# Patient Record
Sex: Female | Born: 1995
Health system: Southern US, Community
[De-identification: ages and names within clinical notes are randomized; demographics above are authoritative.]

## PROBLEM LIST (undated history)

## (undated) ENCOUNTER — Inpatient Hospital Stay: Payer: Self-pay

## (undated) DIAGNOSIS — R011 Cardiac murmur, unspecified: Secondary | ICD-10-CM

## (undated) DIAGNOSIS — F419 Anxiety disorder, unspecified: Secondary | ICD-10-CM

## (undated) DIAGNOSIS — O039 Complete or unspecified spontaneous abortion without complication: Secondary | ICD-10-CM

## (undated) HISTORY — PX: TONSILLECTOMY: SUR1361

## (undated) HISTORY — DX: Cardiac murmur, unspecified: R01.1

---
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
## 2017-06-23 DIAGNOSIS — Z0000 Encounter for general adult medical examination without abnormal findings: Secondary | ICD-10-CM | POA: Diagnosis not present

## 2017-06-23 DIAGNOSIS — Z1389 Encounter for screening for other disorder: Secondary | ICD-10-CM | POA: Diagnosis not present

## 2017-08-07 DIAGNOSIS — M222X2 Patellofemoral disorders, left knee: Secondary | ICD-10-CM | POA: Diagnosis not present

## 2017-08-17 ENCOUNTER — Encounter: Payer: Self-pay | Admitting: Advanced Practice Midwife

## 2017-08-17 ENCOUNTER — Encounter (INDEPENDENT_AMBULATORY_CARE_PROVIDER_SITE_OTHER): Payer: Self-pay

## 2017-08-17 ENCOUNTER — Ambulatory Visit (INDEPENDENT_AMBULATORY_CARE_PROVIDER_SITE_OTHER): Payer: 59 | Admitting: Advanced Practice Midwife

## 2017-08-17 VITALS — BP 102/66 | HR 92 | Ht 64.0 in | Wt 124.0 lb

## 2017-08-17 DIAGNOSIS — Z30011 Encounter for initial prescription of contraceptive pills: Secondary | ICD-10-CM

## 2017-08-17 NOTE — Patient Instructions (Signed)
HPV (Human Papillomavirus) Vaccine: What You Need to Know  1. Why get vaccinated?  HPV vaccine prevents infection with human papillomavirus (HPV) types that are associated with many cancers, including:  · cervical cancer in females,  · vaginal and vulvar cancers in females,  · anal cancer in females and males,  · throat cancer in females and males, and  · penile cancer in males.    In addition, HPV vaccine prevents infection with HPV types that cause genital warts in both females and males.  In the [place] about 12,000 women get cervical cancer every year, and about 4,000 women die from it. HPV vaccine can prevent most of these cases of cervical cancer.  Vaccination is not a substitute for cervical cancer screening. This vaccine does not protect against all HPV types that can cause cervical cancer. Women should still get regular Pap tests.  HPV infection usually comes from sexual contact, and most people will become infected at some point in their life. About 14 million Americans, including teens, get infected every year. Most infections will go away on their own and not cause serious problems. But thousands of women and men get cancer and other diseases from HPV.  2. HPV vaccine  HPV vaccine is approved by FDA and is recommended by CDC for both males and females. It is routinely given at 11 or 19 years of age, but it may be given beginning at age 9 years through age 26 years.  Most adolescents 9 through 19 years of age should get HPV vaccine as a two-dose series with the doses separated by 6-12 months. People who start HPV vaccination at 15 years of age and older should get the vaccine as a three-dose series with the second dose given 1-2 months after the first dose and the third dose given 6 months after the first dose. There are several exceptions to these age recommendations. Your health care provider can give you more information.  3. Some people should not get this vaccine   · Anyone who has had a severe (life-threatening) allergic reaction to a dose of HPV vaccine should not get another dose.  · Anyone who has a severe (life threatening) allergy to any component of HPV vaccine should not get the vaccine.  · Tell your doctor if you have any severe allergies that you know of, including a severe allergy to yeast.  · HPV vaccine is not recommended for pregnant women. If you learn that you were pregnant when you were vaccinated, there is no reason to expect any problems for you or your baby. Any woman who learns she was pregnant when she got HPV vaccine is encouraged to contact the manufacturer's registry for HPV vaccination during pregnancy at 1-800-986-8999. Women who are breastfeeding may be vaccinated.  · If you have a mild illness, such as a cold, you can probably get the vaccine today. If you are moderately or severely ill, you should probably wait until you recover. Your doctor can advise you.  4. Risks of a vaccine reaction  With any medicine, including vaccines, there is a chance of side effects. These are usually mild and go away on their own, but serious reactions are also possible.  Most people who get HPV vaccine do not have any serious problems with it.  Mild or moderate problems following HPV vaccine:  · Reactions in the arm where the shot was given:  ? Soreness (about 9 people in 10)  ? Redness or swelling (about 1 person   in 3)  · Fever:  ? Mild (100°F) (about 1 person in 10)  ? Moderate (102°F) (about 1 person in 65)  · Other problems:  ? Headache (about 1 person in 3)  Problems that could happen after any injected vaccine:  · People sometimes faint after a medical procedure, including vaccination. Sitting or lying down for about 15 minutes can help prevent fainting, and injuries caused by a fall. Tell your doctor if you feel dizzy, or have vision changes or ringing in the ears.  · Some people get severe pain in the shoulder and have difficulty moving  the arm where a shot was given. This happens very rarely.  · Any medication can cause a severe allergic reaction. Such reactions from a vaccine are very rare, estimated at about 1 in a million doses, and would happen within a few minutes to a few hours after the vaccination.  As with any medicine, there is a very remote chance of a vaccine causing a serious injury or death.  The safety of vaccines is always being monitored. For more information, visit: www.cdc.gov/vaccinesafety/.  5. What if there is a serious reaction?  What should I look for?  Look for anything that concerns you, such as signs of a severe allergic reaction, very high fever, or unusual behavior.  Signs of a severe allergic reaction can include hives, swelling of the face and throat, difficulty breathing, a fast heartbeat, dizziness, and weakness. These would usually start a few minutes to a few hours after the vaccination.  What should I do?  If you think it is a severe allergic reaction or other emergency that can't wait, call 9-1-1 or get to the nearest hospital. Otherwise, call your doctor.  Afterward, the reaction should be reported to the Vaccine Adverse Event Reporting System (VAERS). Your doctor should file this report, or you can do it yourself through the VAERS web site at www.vaers.hhs.gov, or by calling 1-800-822-7967.  VAERS does not give medical advice.  6. The National Vaccine Injury Compensation Program  The National Vaccine Injury Compensation Program (VICP) is a federal program that was created to compensate people who may have been injured by certain vaccines.  Persons who believe they may have been injured by a vaccine can learn about the program and about filing a claim by calling 1-800-338-2382 or visiting the VICP website at www.hrsa.gov/vaccinecompensation. There is a time limit to file a claim for compensation.  7. How can I learn more?  · Ask your health care provider. He or she can give you the vaccine  package insert or suggest other sources of information.  · Call your local or state health department.  · Contact the Centers for Disease Control and Prevention (CDC):  ? Call 1-800-232-4636 (1-800-CDC-INFO) or  ? Visit CDC’s website at www.cdc.gov/hpv  Vaccine Information Statement, HPV Vaccine (10/02/2015)  This information is not intended to replace advice given to you by your health care provider. Make sure you discuss any questions you have with your health care provider.  Document Released: 05/14/2014 Document Revised: 07/07/2016 Document Reviewed: 07/07/2016  Elsevier Interactive Patient Education © 2017 Elsevier Inc.

## 2017-08-17 NOTE — Progress Notes (Signed)
[facility] Visit  Patient name: Caroline Good MRN [id number]  Date of birth: 1996-04-21  CC & HPI:  Caroline Good is a 21 y.o. Caucasian female presenting today for birth control eval. Has been on Tri sprintec for 4 years.  Has gotten "cranky" over the last few m o nths.  Mom wants to see if it's d/t BC. Not likely. Happy w/COCs  Not having sex w/anyone now  Declines STD testing . Hasn't had gardisill  Discussed recommendations.   Pertinent History Reviewed:  Medical & Surgical Hx:   History reviewed. No pertinent past medical history. Past Surgical History:  Procedure Laterality Date  . TONSILLECTOMY     Family History  Problem Relation Age of Onset  . Heart disease Paternal Grandfather   . Diabetes Paternal Grandfather   . Heart attack Maternal Grandfather   . Miscarriages / Stillbirths Father   . Miscarriages / [place] Mother     Current Outpatient Prescriptions:  .  aspirin-acetaminophen-caffeine (EXCEDRIN MIGRAINE) [phone] MG per tablet, Take 1 tablet by mouth once as needed for headache., Disp: , Rfl:  .  cyclobenzaprine (FLEXERIL) 10 MG tablet, Use 1 Flexeril in the evening and then at bedtime for spasm pain., Disp: 14 tablet, Rfl: 0 .  ibuprofen (ADVIL,MOTRIN) 400 MG tablet, 1 by mouth with breakfast, after school, and at bedtime., Disp: 30 tablet, Rfl: 0 .  norgestimate-ethinyl estradiol (SPRINTEC 28) 0.25-35 MG-MCG tablet, Take 1 tablet by mouth at bedtime., Disp: , Rfl:  Social History: Reviewed -  reports that she has never smoked. She has never used smokeless tobacco.  Review of Systems:   Constitutional: Negative for fever and chills Eyes: Negative for visual disturbances Respiratory: Negative for shortness of breath, dyspnea Cardiovascular: Negative for chest pain or palpitations  Gastrointestinal: Negative for vomiting, diarrhea and constipation; no abdominal pain Genitourinary: Negative for dysuria and urgency, vaginal irritation or  itching Musculoskeletal: Negative for back pain, joint pain, myalgias  Neurological: Negative for dizziness and headaches    Objective Findings:    Physical Examination: General appearance - well appearing, and in no distress Mental status - alert, oriented to person, place, and time Chest:  Normal respiratory effort Heart - normal rate and regular rhythm Abdomen:  Soft, nontender Pelvic: deferred Musculoskeletal:  Normal range of motion without pain Extremities:  No edema    No results found for this or any previous visit (from the past 24 hour(s)).    Assessment & Plan:  A:   Moodiness, not likely d/t bc.   P:  Will change to monophasic just in case   No Follow-up on file.  CRESENZO-DISHMAN,Kieth Hartis CNM 08/17/2017 12:09 PM

## 2017-08-25 ENCOUNTER — Telehealth: Payer: Self-pay | Admitting: Advanced Practice Midwife

## 2017-08-25 NOTE — Telephone Encounter (Signed)
LMOVM that Drenda FreezeFran was out of the office and Selena BattenKim did not see in the note what she was going to prescribe. Will send to [place].

## 2017-08-29 ENCOUNTER — Telehealth: Payer: Self-pay | Admitting: Advanced Practice Midwife

## 2017-08-29 NOTE — Telephone Encounter (Signed)
Patient's mom called stating that Caroline Good was suppose to come in on Monday and see what happen regarding her daughter medication. Pt's mother would like to know if there was anything discussed. Please contact pt's mother

## 2017-08-29 NOTE — Telephone Encounter (Signed)
Please send prescription for BCP. It was not sent at last appointment.

## 2017-08-30 ENCOUNTER — Other Ambulatory Visit: Payer: Self-pay | Admitting: Advanced Practice Midwife

## 2017-08-30 MED ORDER — NORGESTIMATE-ETH ESTRADIOL 0.25-35 MG-MCG PO TABS: 1.0000 | ORAL_TABLET | Freq: Every day | ORAL | 11 refills | Status: DC

## 2017-08-30 NOTE — Telephone Encounter (Signed)
sprintec sent to pharmacy 

## 2017-08-30 NOTE — Telephone Encounter (Signed)
[facility]

## 2017-08-30 NOTE — Telephone Encounter (Signed)
rx for sprintec sent (changing to monophasic version)

## 2017-09-25 ENCOUNTER — Telehealth: Payer: Self-pay | Admitting: *Deleted

## 2017-09-25 NOTE — Telephone Encounter (Signed)
Can we send this script to [facility] for home delivery?

## 2017-09-27 ENCOUNTER — Other Ambulatory Visit: Payer: Self-pay | Admitting: Advanced Practice Midwife

## 2017-09-27 MED ORDER — NORGESTIMATE-ETH ESTRADIOL 0.25-35 MG-MCG PO TABS: 1.0000 | ORAL_TABLET | Freq: Every day | ORAL | 3 refills | Status: DC

## 2017-09-27 NOTE — Telephone Encounter (Signed)
I sent the birth control to [facility] w/90 day supply

## 2017-09-27 NOTE — Progress Notes (Signed)
rx for 3 months supply Sprintec sent to [facility]

## 2018-06-14 ENCOUNTER — Telehealth: Payer: Self-pay | Admitting: *Deleted

## 2018-06-14 NOTE — Telephone Encounter (Signed)
Pt is aware that topomax can decrease efficacy of COCs  Plans to use condoms if has sex. Doesn't want to change methods

## 2018-06-14 NOTE — Telephone Encounter (Signed)
[facility] called to check about an interaction between patients sprintec and topiramate. Topiramate decreases effectiveness of sprintec. Pharmacy wanted to know if Drenda FreezeFran still wants to prescribe it or does she want to make a change.  Callback for pharmacy. [phone] Ref number [id number]

## 2018-07-29 ENCOUNTER — Encounter (HOSPITAL_COMMUNITY): Payer: Self-pay | Admitting: Emergency Medicine

## 2018-07-29 ENCOUNTER — Other Ambulatory Visit: Payer: Self-pay

## 2018-07-29 ENCOUNTER — Emergency Department (HOSPITAL_COMMUNITY): Payer: Managed Care, Other (non HMO)

## 2018-07-29 ENCOUNTER — Emergency Department (HOSPITAL_COMMUNITY)
Admission: EM | Admit: 2018-07-29 | Discharge: 2018-07-29 | Disposition: A | Discharge status: Home / Self Care | Payer: Managed Care, Other (non HMO) | Attending: Emergency Medicine | Admitting: Emergency Medicine

## 2018-07-29 DIAGNOSIS — Z79899 Other long term (current) drug therapy: Secondary | ICD-10-CM | POA: Diagnosis not present

## 2018-07-29 DIAGNOSIS — R0602 Shortness of breath: Secondary | ICD-10-CM | POA: Diagnosis present

## 2018-07-29 DIAGNOSIS — F419 Anxiety disorder, unspecified: Secondary | ICD-10-CM | POA: Diagnosis not present

## 2018-07-29 LAB — CBC WITH DIFFERENTIAL/PLATELET
Basophils Absolute: 0 10*3/uL (ref 0.0–0.1)
Basophils Relative: 0 %
Eosinophils Absolute: 0.1 10*3/uL (ref 0.0–0.7)
Eosinophils Relative: 1 %
HEMATOCRIT: 36.7 % (ref 36.0–46.0)
Hemoglobin: 12.4 g/dL (ref 12.0–15.0)
LYMPHS ABS: 1.9 10*3/uL (ref 0.7–4.0)
LYMPHS PCT: 18 %
MCH: 31.1 pg (ref 26.0–34.0)
MCHC: 33.8 g/dL (ref 30.0–36.0)
MCV: 92 fL (ref 78.0–100.0)
MONO ABS: 0.8 10*3/uL (ref 0.1–1.0)
Monocytes Relative: 7 %
NEUTROS ABS: 7.5 10*3/uL (ref 1.7–7.7)
Neutrophils Relative %: 74 %
Platelets: 224 10*3/uL (ref 150–400)
RBC: 3.99 MIL/uL (ref 3.87–5.11)
RDW: 12.6 % (ref 11.5–15.5)
WBC: 10.3 10*3/uL (ref 4.0–10.5)

## 2018-07-29 LAB — BASIC METABOLIC PANEL
Anion gap: 9 (ref 5–15)
BUN: 9 mg/dL (ref 6–20)
CHLORIDE: 111 mmol/L (ref 98–111)
CO2: 19 mmol/L — ABNORMAL LOW (ref 22–32)
Calcium: 9.4 mg/dL (ref 8.9–10.3)
Creatinine, Ser: 0.77 mg/dL (ref 0.44–1.00)
GFR calc Af Amer: >60 mL/min (ref >60)
GFR calc non Af Amer: >60 mL/min (ref >60)
GLUCOSE: 119 mg/dL — AB (ref 70–99)
POTASSIUM: 3.2 mmol/L — AB (ref 3.5–5.1)
Sodium: 139 mmol/L (ref 135–145)

## 2018-07-29 LAB — D-DIMER, QUANTITATIVE: D-Dimer, Quant: 0.49 ug/mL-FEU (ref 0.00–0.50)

## 2018-07-29 LAB — TROPONIN I

## 2018-07-29 MED ORDER — LORAZEPAM 0.5 MG PO TABS: 0.5000 mg | ORAL_TABLET | Freq: Three times a day (TID) | ORAL | 0 refills | Status: DC

## 2018-07-29 MED ORDER — LORAZEPAM 0.5 MG PO TABS
0.5000 mg | ORAL_TABLET | Freq: Once | ORAL | Status: AC
  Administered 2018-07-29: 0.5 mg via ORAL
  Filled 2018-07-29: qty 1

## 2018-07-29 NOTE — ED Provider Notes (Signed)
[facility] EMERGENCY DEPARTMENT Provider Note   CSN: [id number] Arrival date & time: 07/29/18  1938     History   Chief Complaint Chief Complaint  Patient presents with  . Panic Attack    HPI Caroline Good is a 22 y.o. female.  HPI   Caroline Good is a 21 y.o. female who presents to the Emergency Department complaining of sudden onset of shortness of breath, increased heart rate and "my head feels like air."  Patient reports increased family stressors and family history of anxiety attacks.  She has been experiencing increased heart rate and shortness of breath intermittently for 2 weeks.  She has not seen a medical provider for this.  She states that her mother gave her a quarter of a tablet of Xanax at 11 am today which helped her symptoms.  This afternoon, she reports being upset and crying stating that her father had a heart attack 2 days ago.  She describes a near fainting episode this afternoon.  She does admit to decreased appetite and drinking energy drinks.  She denies dizziness, vomiting, fever, numbness or weakness of the extremities.  Patient does also admit to a new tattoo to her left lower leg with some associated swelling of her leg.  She denies pain to her leg with weightbearing.  She also takes oral birth control.  No hx of PE/DVT.  Family hx of generalized anxiety   History reviewed. No pertinent past medical history.  There are no active problems to display for this patient.   Past Surgical History:  Procedure Laterality Date  . TONSILLECTOMY       OB History    Gravida  0   Para  0   Term  0   Preterm  0   AB  0   Living  0     SAB  0   TAB  0   Ectopic  0   Multiple  0   Live Births  0            Home Medications    Prior to Admission medications   Medication Sig Start Date End Date Taking? Authorizing Provider  aspirin-acetaminophen-caffeine (EXCEDRIN MIGRAINE) [phone] MG per tablet Take 1 tablet by mouth once as needed  for headache.    [provider]  cyclobenzaprine (FLEXERIL) 10 MG tablet Use 1 Flexeril in the evening and then at bedtime for spasm pain. 12/28/16   Ivery Quale, PA-C  ibuprofen (ADVIL,MOTRIN) 400 MG tablet 1 by mouth with breakfast, after school, and at bedtime. 12/28/16   Ivery Quale, PA-C  norgestimate-ethinyl estradiol (SPRINTEC 28) 0.25-35 MG-MCG tablet Take 1 tablet by mouth at bedtime. 09/27/17   Jacklyn Shell, CNM    Family History Family History  Problem Relation Age of Onset  . Heart disease Paternal Grandfather   . Diabetes Paternal Grandfather   . Heart attack Maternal Grandfather   . Miscarriages / Stillbirths Father   . Miscarriages / [place] Mother     Social History Social History   Tobacco Use  . Smoking status: Never Smoker  . Smokeless tobacco: Never Used  Substance Use Topics  . Alcohol use: No  . Drug use: No     Allergies   Patient has no known allergies.   Review of Systems Review of Systems  Constitutional: Negative for chills and fever.  HENT: Negative for congestion, sore throat and trouble swallowing.   Eyes: Negative for visual disturbance.  Respiratory: Positive for shortness  of breath. Negative for cough, chest tightness and wheezing.   Cardiovascular: Negative for chest pain and palpitations.       Rapid heart rate  Gastrointestinal: Negative for abdominal pain, nausea and vomiting.  Genitourinary: Negative for dysuria and flank pain.  Neurological: Positive for headaches. Negative for dizziness, seizures, syncope, speech difficulty and weakness.     Physical Exam Updated Vital Signs BP 130/62   Pulse (!) 111   Temp 98.2 F (36.8 C) (Oral)   Resp (!) 25   Ht 5\' 4"  (1.626 m)   Wt 54.4 kg   LMP 06/28/2018 (Approximate)   SpO2 100%   BMI 20.60 kg/m   Physical Exam  Constitutional: She is oriented to person, place, and time. She appears well-developed.  Patient is anxious appearing  HENT:    Mouth/Throat: Oropharynx is clear and moist.  Eyes: Pupils are equal, round, and reactive to light. Conjunctivae and EOM are normal.  Neck: Normal range of motion. Neck supple. No JVD present.  Cardiovascular: Regular rhythm and intact distal pulses. Tachycardia present.  Pulmonary/Chest: Effort normal and breath sounds normal. No respiratory distress.  Pt breathing is shallow and rapid  Musculoskeletal: Normal range of motion.  Recent tattoo of left lower extremity.  Mild edema noted of the left lower leg.  No erythema or excessive warmth. No excessive erythema surrounding the tattoo.  Negative Homans sign  Lymphadenopathy:    She has no cervical adenopathy.  Neurological: She is alert and oriented to person, place, and time. No sensory deficit.  Skin: Skin is warm. Capillary refill takes less than 2 seconds. No rash noted.  Psychiatric: Her mood appears anxious.  Nursing note and vitals reviewed.    ED Treatments / Results  Labs (all labs ordered are listed, but only abnormal results are displayed) Labs Reviewed  BASIC METABOLIC PANEL - Abnormal; Notable for the following components:      Result Value   Potassium 3.2 (*)    CO2 19 (*)    Glucose, Bld 119 (*)    All other components within normal limits  TROPONIN I  D-DIMER, QUANTITATIVE (NOT AT [facility])  CBC WITH DIFFERENTIAL/PLATELET  POC URINE PREG, ED    EKG EKG Interpretation  Date/Time:  Sunday July 29 2018 20:48:43 EDT Ventricular Rate:  100 PR Interval:  146 QRS Duration: 84 QT Interval:  338 QTC Calculation: 436 R Axis:   88 Text Interpretation:  Normal sinus rhythm Nonspecific T wave abnormality Abnormal ECG Since last tracing rate slower Confirmed by Mancel Bale [phone]) on 07/29/2018 8:56:03 PM   Radiology Dg Chest 2 View  Result Date: 07/29/2018 CLINICAL DATA:  Shortness of breath. EXAM: CHEST - 2 VIEW COMPARISON:  Radiographs of December 11, 2014. FINDINGS: The heart size and mediastinal contours  are within normal limits. Both lungs are clear. No pneumothorax or pleural effusion is noted. The visualized skeletal structures are unremarkable. IMPRESSION: No active cardiopulmonary disease. Electronically Signed   By: Lupita Raider, M.D.   On: 07/29/2018 21:48    Procedures Procedures (including critical care time)  Medications Ordered in ED Medications - No data to display   Initial Impression / Assessment and Plan / ED Course  I have reviewed the triage vital signs and the nursing notes.  Pertinent labs & imaging results that were available during my care of the patient were reviewed by me and considered in my medical decision making (see chart for details).     Pt very anxious appearing upon arrival,  she has calmed down during ED stay.  Family at bedside talking to her.  Workup including D dimer and repeat vitals reassuring.    On recheck, pt reports feeling better and ready for d/c.  Pt advised to d/c energy drinks.  Has recent family stressors, denies SI or HI thoughts or plans.  I will provide short course of anti-anxiety med until she can f/u with her PCP this week.  Return precautions discussed.   Final Clinical Impressions(s) / ED Diagnoses   Final diagnoses:  Anxiety    ED Discharge Orders    None       Rosey Bath 07/30/18 1722    Raeford Razor, MD 07/30/18 2322

## 2018-07-29 NOTE — ED Triage Notes (Signed)
Pt states she has new onset panic attacks x 2 weeks ago, 10 mins ago started having a panic attack feeling funny, increased HR and feeling SOB, pt does not have RX meds for condition but mother gave her a 1/4 of Xanax around 1100 which helped

## 2018-07-29 NOTE — Discharge Instructions (Addendum)
As discussed, do not drink energy drinks.  Follow-up with your primary doctor later this week.  Return to the ER for any worsening symptoms.

## 2018-11-19 DIAGNOSIS — J069 Acute upper respiratory infection, unspecified: Secondary | ICD-10-CM | POA: Diagnosis not present

## 2018-11-19 DIAGNOSIS — H0520 Unspecified exophthalmos: Secondary | ICD-10-CM | POA: Diagnosis not present

## 2018-11-19 DIAGNOSIS — Z1389 Encounter for screening for other disorder: Secondary | ICD-10-CM | POA: Diagnosis not present

## 2018-12-17 ENCOUNTER — Other Ambulatory Visit: Payer: Self-pay

## 2018-12-17 ENCOUNTER — Encounter (HOSPITAL_COMMUNITY): Payer: Self-pay

## 2018-12-17 ENCOUNTER — Emergency Department (HOSPITAL_COMMUNITY): Payer: 59

## 2018-12-17 ENCOUNTER — Emergency Department (HOSPITAL_COMMUNITY)
Admission: EM | Admit: 2018-12-17 | Discharge: 2018-12-17 | Disposition: A | Discharge status: Home / Self Care | Payer: 59 | Attending: Emergency Medicine | Admitting: Emergency Medicine

## 2018-12-17 DIAGNOSIS — R0789 Other chest pain: Secondary | ICD-10-CM

## 2018-12-17 DIAGNOSIS — R112 Nausea with vomiting, unspecified: Secondary | ICD-10-CM | POA: Diagnosis not present

## 2018-12-17 DIAGNOSIS — R0602 Shortness of breath: Secondary | ICD-10-CM | POA: Insufficient documentation

## 2018-12-17 DIAGNOSIS — R42 Dizziness and giddiness: Secondary | ICD-10-CM | POA: Insufficient documentation

## 2018-12-17 DIAGNOSIS — R61 Generalized hyperhidrosis: Secondary | ICD-10-CM | POA: Insufficient documentation

## 2018-12-17 DIAGNOSIS — Z79899 Other long term (current) drug therapy: Secondary | ICD-10-CM | POA: Diagnosis not present

## 2018-12-17 DIAGNOSIS — R079 Chest pain, unspecified: Secondary | ICD-10-CM | POA: Diagnosis not present

## 2018-12-17 HISTORY — DX: Anxiety disorder, unspecified: F41.9

## 2018-12-17 LAB — CBC
HEMATOCRIT: 39.4 % (ref 36.0–46.0)
HEMOGLOBIN: 12.7 g/dL (ref 12.0–15.0)
MCH: 30.9 pg (ref 26.0–34.0)
MCHC: 32.2 g/dL (ref 30.0–36.0)
MCV: 95.9 fL (ref 80.0–100.0)
Platelets: 225 10*3/uL (ref 150–400)
RBC: 4.11 MIL/uL (ref 3.87–5.11)
RDW: 12.6 % (ref 11.5–15.5)
WBC: 8.8 10*3/uL (ref 4.0–10.5)
nRBC: 0 % (ref 0.0–0.2)

## 2018-12-17 LAB — BASIC METABOLIC PANEL
Anion gap: 9 (ref 5–15)
BUN: 15 mg/dL (ref 6–20)
CHLORIDE: 108 mmol/L (ref 98–111)
CO2: 21 mmol/L — AB (ref 22–32)
Calcium: 9 mg/dL (ref 8.9–10.3)
Creatinine, Ser: 0.58 mg/dL (ref 0.44–1.00)
GFR calc Af Amer: >60 mL/min (ref >60)
GLUCOSE: 79 mg/dL (ref 70–99)
Potassium: 4 mmol/L (ref 3.5–5.1)
SODIUM: 138 mmol/L (ref 135–145)

## 2018-12-17 LAB — I-STAT BETA HCG BLOOD, ED (NOT ORDERABLE): I-stat hCG, quantitative: <5 m[IU]/mL (ref <5)

## 2018-12-17 LAB — TROPONIN I
Troponin I: <0.03 ng/mL (ref <0.03)
Troponin I: <0.03 ng/mL (ref <0.03)

## 2018-12-17 LAB — D-DIMER, QUANTITATIVE: D-Dimer, Quant: <0.27 ug/mL-FEU (ref 0.00–0.50)

## 2018-12-17 MED ORDER — IBUPROFEN 400 MG PO TABS
600.0000 mg | ORAL_TABLET | Freq: Once | ORAL | Status: AC
  Administered 2018-12-17: 600 mg via ORAL
  Filled 2018-12-17: qty 2

## 2018-12-17 MED ORDER — IBUPROFEN 400 MG PO TABS: 400.0000 mg | ORAL_TABLET | Freq: Four times a day (QID) | ORAL | 0 refills | Status: DC | PRN

## 2018-12-17 MED ORDER — SODIUM CHLORIDE 0.9% FLUSH: 3.0000 mL | Freq: Once | INTRAVENOUS | Status: DC

## 2018-12-17 NOTE — ED Provider Notes (Signed)
[facility] EMERGENCY DEPARTMENT Provider Note   CSN: [id number] Arrival date & time: 12/17/18  1501    History   Chief Complaint Chief Complaint  Patient presents with  . Chest Pain    HPI Caroline Good is a 23 y.o. female.  She said she has had skipped heartbeats intermittently since as long as she can remember.  Since Saturday when she gets these skipped beats it is now painful.  It can occur every 15 to 30 minutes and she feels a stabbing sensation in the center of her chest.  It does not radiate anywhere.  It is not associated with shortness of breath sweatiness dizziness lightheadedness nausea or vomiting.  There is no history of any trauma.  No recent travel no immobilization.  She is on an OCP.     The history is provided by the patient.  Chest Pain  Pain location:  Substernal area Pain quality: sharp and stabbing   Pain radiates to:  Does not radiate Pain severity:  Moderate Onset quality:  Sudden Timing:  Intermittent Progression:  Unchanged Chronicity:  New Relieved by:  None tried Worsened by:  Exertion Ineffective treatments:  None tried Associated symptoms: no abdominal pain, no back pain, no cough, no diaphoresis, no fever, no headache, no heartburn, no lower extremity edema, no nausea, no shortness of breath, no syncope and no vomiting   Risk factors: birth control     Past Medical History:  Diagnosis Date  . Anxiety     There are no active problems to display for this patient.   Past Surgical History:  Procedure Laterality Date  . TONSILLECTOMY       OB History    Gravida  0   Para  0   Term  0   Preterm  0   AB  0   Living  0     SAB  0   TAB  0   Ectopic  0   Multiple  0   Live Births  0            Home Medications    Prior to Admission medications   Medication Sig Start Date End Date Taking? Authorizing Provider  aspirin-acetaminophen-caffeine (EXCEDRIN MIGRAINE) [phone] MG per tablet Take 1 tablet by mouth  once as needed for headache.    [provider]  cyclobenzaprine (FLEXERIL) 10 MG tablet Use 1 Flexeril in the evening and then at bedtime for spasm pain. 12/28/16   Ivery Quale, PA-C  ibuprofen (ADVIL,MOTRIN) 400 MG tablet 1 by mouth with breakfast, after school, and at bedtime. 12/28/16   Ivery Quale, PA-C  LORazepam (ATIVAN) 0.5 MG tablet Take 1 tablet (0.5 mg total) by mouth every 8 (eight) hours. As needed 07/29/18   Triplett, Tammy, PA-C  norgestimate-ethinyl estradiol (SPRINTEC 28) 0.25-35 MG-MCG tablet Take 1 tablet by mouth at bedtime. 09/27/17   Jacklyn Shell, CNM    Family History Family History  Problem Relation Age of Onset  . Heart disease Paternal Grandfather   . Diabetes Paternal Grandfather   . Heart attack Maternal Grandfather   . Miscarriages / Stillbirths Father   . Miscarriages / [place] Mother     Social History Social History   Tobacco Use  . Smoking status: Never Smoker  . Smokeless tobacco: Never Used  Substance Use Topics  . Alcohol use: No  . Drug use: No     Allergies   Patient has no known allergies.   Review of Systems Review  of Systems  Constitutional: Negative for diaphoresis and fever.  HENT: Negative for sore throat.   Eyes: Negative for visual disturbance.  Respiratory: Negative for cough and shortness of breath.   Cardiovascular: Positive for chest pain. Negative for syncope.  Gastrointestinal: Negative for abdominal pain, heartburn, nausea and vomiting.  Genitourinary: Negative for dysuria.  Musculoskeletal: Negative for back pain.  Skin: Negative for rash.  Neurological: Negative for headaches.     Physical Exam Updated Vital Signs BP 120/74 (BP Location: Right Arm)   Pulse 95   Temp 98.5 F (36.9 C) (Oral)   Resp 18   Ht 5\' 4"  (1.626 m)   Wt 45.8 kg   LMP 12/10/2018   SpO2 97%   BMI 17.34 kg/m   Physical Exam Vitals signs and nursing note reviewed.  Constitutional:      General: She is  not in acute distress.    Appearance: She is well-developed.  HENT:     Head: Normocephalic and atraumatic.  Eyes:     Conjunctiva/sclera: Conjunctivae normal.  Neck:     Musculoskeletal: Neck supple.  Cardiovascular:     Rate and Rhythm: Normal rate and regular rhythm.     Heart sounds: No murmur.  Pulmonary:     Effort: Pulmonary effort is normal. No respiratory distress.     Breath sounds: Normal breath sounds.  Chest:     Chest wall: Tenderness (sternal - reproduces pain) present.  Abdominal:     Palpations: Abdomen is soft.     Tenderness: There is no abdominal tenderness.  Musculoskeletal:     Right lower leg: She exhibits no tenderness. No edema.     Left lower leg: She exhibits no tenderness. No edema.  Skin:    General: Skin is warm and dry.     Capillary Refill: Capillary refill takes less than 2 seconds.  Neurological:     General: No focal deficit present.     Mental Status: She is alert and oriented to person, place, and time.     Motor: No weakness.      ED Treatments / Results  Labs (all labs ordered are listed, but only abnormal results are displayed) Labs Reviewed  BASIC METABOLIC PANEL - Abnormal; Notable for the following components:      Result Value   CO2 21 (*)    All other components within normal limits  CBC  TROPONIN I  TROPONIN I  D-DIMER, QUANTITATIVE (NOT AT [facility])  I-STAT BETA HCG BLOOD, ED ([facility], [facility], [facility] ONLY)  I-STAT BETA HCG BLOOD, ED (NOT ORDERABLE)    EKG EKG Interpretation  Date/Time:  Monday December 17 2018 19:57:07 EST Ventricular Rate:  78 PR Interval:    QRS Duration: 92 QT Interval:  385 QTC Calculation: 439 R Axis:   92 Text Interpretation:  Sinus rhythm Borderline right axis deviation RSR' in V1 or V2, probably normal variant Baseline wander in lead(s) V3 V6 similar to prior 9/19 Confirmed by Meridee ScoreButler, Analleli Gierke [phone]) on 12/17/2018 7:59:09 PM   Radiology Dg Chest 2 View  Result Date: 12/17/2018 CLINICAL DATA:  Chest  pain EXAM: CHEST - 2 VIEW COMPARISON:  July 29, 2018 FINDINGS: Lungs are clear. Heart size and pulmonary vascularity are normal. No adenopathy. No pneumothorax. No bone lesions. IMPRESSION: No edema or consolidation. Electronically Signed   By: Bretta BangWilliam  Woodruff III M.D.   On: 12/17/2018 15:40    Procedures Procedures (including critical care time)  Medications Ordered in ED Medications  sodium chloride flush (  NS) 0.9 % injection 3 mL (3 mLs Intravenous Not Given 12/17/18 1937)  ibuprofen (ADVIL,MOTRIN) tablet 600 mg (600 mg Oral Given 12/17/18 2017)     Initial Impression / Assessment and Plan / ED Course  I have reviewed the triage vital signs and the nursing notes.  Pertinent labs & imaging results that were available during my care of the patient were reviewed by me and considered in my medical decision making (see chart for details).  Clinical Course as of Dec 17 2342  Mon Dec 17, 2018  1951 ECG is normal sinus rhythm, right axis deviation no acute ST-T changes.   [MB]    Clinical Course User Index [MB] Terrilee Files, MD      Patient's lab work including delta troponin and d-dimer are negative along with a normal chest x-ray.  She has reproducible chest wall tenderness.  Will treat for costochondritis.  Final Clinical Impressions(s) / ED Diagnoses   Final diagnoses:  Chest wall pain    ED Discharge Orders         Ordered    ibuprofen (ADVIL,MOTRIN) 400 MG tablet  Every 6 hours PRN     12/17/18 2121           Terrilee Files, MD 12/17/18 2344

## 2018-12-17 NOTE — ED Triage Notes (Addendum)
Pt reports that the center of her chest is hurting and it has been hurting off and on since. Pt reports a flutter feeling and then sharp pain Pt reports feels tight all the time Mother reports 20 lb weight loss in the past month

## 2018-12-17 NOTE — Discharge Instructions (Addendum)
You were seen in the emergency department for 3 days of pain in your chest.  You had blood work chest x-ray and EKG that did not show any serious findings.  You should follow-up with your primary care doctor for further evaluation.  We are prescribing you some ibuprofen to use 3 times a day with food as needed for the pain.  Please return if any concerns.

## 2019-02-19 DIAGNOSIS — M222X2 Patellofemoral disorders, left knee: Secondary | ICD-10-CM | POA: Diagnosis not present

## 2019-04-29 ENCOUNTER — Other Ambulatory Visit: Payer: Self-pay

## 2019-04-29 ENCOUNTER — Encounter (HOSPITAL_COMMUNITY): Payer: Self-pay

## 2019-04-29 ENCOUNTER — Ambulatory Visit (HOSPITAL_COMMUNITY): Payer: 59 | Attending: Orthopedic Surgery

## 2019-04-29 DIAGNOSIS — R29898 Other symptoms and signs involving the musculoskeletal system: Secondary | ICD-10-CM | POA: Insufficient documentation

## 2019-04-29 DIAGNOSIS — M25511 Pain in right shoulder: Secondary | ICD-10-CM | POA: Diagnosis not present

## 2019-04-29 NOTE — Patient Instructions (Signed)
  Flexibility: Upper Trapezius Stretch   Gently grasp right side of head while reaching behind back with other hand. Tilt head away until a gentle stretch is felt. Hold ____ seconds. Repeat ____ times per set. Do ____ sets per session. Do ____ sessions per day.  http://orth.exer.us/340   Levator Stretch   Grasp seat or sit on hand on side to be stretched. Turn head toward other side and look down. Use hand on head to gently stretch neck in that position. Hold ____ seconds. Repeat on other side. Repeat ____ times. Do ____ sessions per day.    Posture - Sitting   Sit upright, head facing forward. Try using a roll to support lower back. Keep shoulders relaxed, and avoid rounded back. Keep hips level with knees. Avoid crossing legs for long periods.  Upper Trapezius Mobilization with ball  Pin down tender portion of upper trapezius. Roll ball across tender point for at least 2 minutes Other options: Pin down tender spot and perform active arm motions or contract/relax technique.     suboccipital release  While standing, position the base of your head to be against a pair of taped lacrosse balls. Gently move head back and forth against the balls. This exercise works to release the suboccipitals and the insertion sites of the Upper Trapezius muscles. Continue to move your head back and forth at your own pace for a total of 2 minutes.
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
## 2019-05-07 ENCOUNTER — Encounter (HOSPITAL_COMMUNITY): Payer: Self-pay

## 2019-05-07 ENCOUNTER — Ambulatory Visit (HOSPITAL_COMMUNITY): Payer: 59 | Attending: Orthopedic Surgery

## 2019-05-07 ENCOUNTER — Other Ambulatory Visit: Payer: Self-pay

## 2019-05-07 DIAGNOSIS — M25511 Pain in right shoulder: Secondary | ICD-10-CM | POA: Insufficient documentation

## 2019-05-07 DIAGNOSIS — R29898 Other symptoms and signs involving the musculoskeletal system: Secondary | ICD-10-CM

## 2019-05-07 NOTE — Therapy (Signed)
[place], [place], [address] Phone: [phone]   Fax:  [phone]  Occupational Therapy Treatment  Patient Details  Name: Caroline Good MRN: [id number] Date of Birth: 12-19-1995 Referring Provider (OT): Dr. Almedia Balls   Encounter Date: 05/07/2019  OT End of Session - 05/07/19 1422    Visit Number  2    Number of Visits  8    Date for OT Re-Evaluation  05/27/19    Authorization Type  [employer]    Authorization Time Period  60 visit limit- 0 used. No auth required.    Authorization - Visit Number  2    Authorization - Number of Visits  26    OT Start Time  3212    OT Stop Time  1413    OT Time Calculation (min)  39 min       Past Medical History:  Diagnosis Date  . Anxiety     Past Surgical History:  Procedure Laterality Date  . TONSILLECTOMY      There were no vitals filed for this visit.  Subjective Assessment - 05/07/19 1423    Subjective   S: I don't have any pain right now.    Currently in Pain?  No/denies         [facility] OT Assessment - 05/07/19 1346      Assessment   Medical Diagnosis  right shoulder and cervical pinch nerve    Prior Therapy  None for this condition               OT Treatments/Exercises (OP) - 05/07/19 1347      Exercises   Exercises  Shoulder      Shoulder Exercises: Prone   Other Prone Exercises  Bent over single arm Y and T; 2#; 12X      Shoulder Exercises: Sidelying   External Rotation  Strengthening;12 reps    External Rotation Weight (lbs)  3 with towel roll    Internal Rotation  Strengthening;12 reps    Internal Rotation Weight (lbs)  3 with towel roll    Flexion  Strengthening;12 reps    Flexion Weight (lbs)  2    ABduction  Strengthening;12 reps    ABduction Weight (lbs)  2    Other Sidelying Exercises  protraction; 2#; 12X    Other Sidelying Exercises  Horizontal abduction; 2#, 12X      Shoulder Exercises: Standing   Extension   Theraband;12 reps    Theraband Level (Shoulder Extension)  Level 2 (Red)    Row  Theraband;12 reps    Theraband Level (Shoulder Row)  Level 2 (Red)    Retraction  Theraband;12 reps    Theraband Level (Shoulder Retraction)  Level 2 (Red)      Shoulder Exercises: ROM/Strengthening   Other ROM/Strengthening Exercises  Standing Angel Wings; 10X      Manual Therapy   Manual Therapy  Myofascial release    Manual therapy comments  Manual therapy completed prior to exercises.     Myofascial Release  Myofascial release completed to right upper trapezius and scapularis region to decrease fascial restrictions and allow for ROM in a pain free zone.              OT Education - 05/07/19 1412    Education Details  Red scapular theraband exercises provided for HEP. Reviewed goals with patient.    Person(s) Educated  Patient    Methods  Explanation;Demonstration;Verbal cues;Handout  Comprehension  Returned demonstration;Verbalized understanding       OT Short Term Goals - 05/07/19 1345      OT SHORT TERM GOAL #1   Title  Patient will be educated and independent with her HEP to decrease muscle tightness and pain and allow her to complete work related task with increased comfort.    Time  4    Period  Weeks    Status  On-going    Target Date  05/27/19      OT SHORT TERM GOAL #2   Title  Patient will decrease fascial retrictions to min amount or less in order decrease pain and increase ability to complete lifting and reach tasks while using her RUE.    Time  4    Period  Weeks    Status  On-going      OT SHORT TERM GOAL #3   Title  Patient will increase right middle and lower trapezius strength to 5/5 in order to complete work related lifting tasks with increased stability and scapular stability.    Time  4    Period  Weeks    Status  On-going      OT SHORT TERM GOAL #4   Title  Patient will report a decrease of pain when using her RUE for daily and work related tasks of 3/10 on  average in order to continously use her RUE during daily and work related tasks.    Time  4    Period  Weeks    Status  On-going               Plan - 05/07/19 1412    Clinical Impression Statement  A: Initiated myofascial release and middle trapezius and scapular strengthening with use of hand weights. Updated HEP with red band scapular exercises. Required VC for form and technique. Pt reports soreness at end of session.    Body Structure / Function / Physical Skills  ADL;Fascial restriction;Strength;Pain    Plan  P: continue with scapular strengthening. Add therapy ball shoulder strengthening exercises. reverse on UBE bike. Electa [place] holds.    Consulted and Agree with Plan of Care  Patient       Patient will benefit from skilled therapeutic intervention in order to improve the following deficits and impairments:   Body Structure / Function / Physical Skills: ADL, Fascial restriction, Strength, Pain       Visit Diagnosis: 1. Other symptoms and signs involving the musculoskeletal system   2. Acute pain of right shoulder       Problem List There are no active problems to display for this patient.  Limmie PatriciaLaura Vance Hochmuth, OTR/L,CBIS  [phone]  05/07/2019, 2:23 PM  [place] [facility] [address] [place], [place], [address] Phone: [phone]   Fax:  [phone]  Name: Caroline Good MRN: [id number] Date of Birth: [birth year]

## 2019-05-07 NOTE — Patient Instructions (Signed)

## 2019-05-09 ENCOUNTER — Ambulatory Visit (HOSPITAL_COMMUNITY): Payer: 59

## 2019-05-09 ENCOUNTER — Other Ambulatory Visit: Payer: Self-pay

## 2019-05-09 ENCOUNTER — Encounter (HOSPITAL_COMMUNITY): Payer: Self-pay

## 2019-05-09 DIAGNOSIS — R29898 Other symptoms and signs involving the musculoskeletal system: Secondary | ICD-10-CM | POA: Diagnosis not present

## 2019-05-09 DIAGNOSIS — M25511 Pain in right shoulder: Secondary | ICD-10-CM

## 2019-05-09 NOTE — Therapy (Signed)
Caroline Good [address] [facility] [place], [place], [address] Phone: [phone]   Fax:  [phone]  Occupational Therapy Treatment  Patient Details  Name: Caroline Good MRN: [id number] Date of Birth: 02-03-1996 Referring Provider (OT): Dr. Lunette StandsAnna Voytek   Encounter Date: 05/09/2019  OT End of Session - 05/09/19 1413    Visit Number  3    Number of Visits  8    Date for OT Re-Evaluation  05/27/19    Authorization Type  [employer]    Authorization Time Period  60 visit limit- 0 used. No auth required.    Authorization - Visit Number  3    Authorization - Number of Visits  60    OT Start Time  1336    OT Stop Time  1415    OT Time Calculation (min)  39 min    Activity Tolerance  Patient tolerated treatment well    Behavior During Therapy  WFL for tasks assessed/performed       Past Medical History:  Diagnosis Date  . Anxiety     Past Surgical History:  Procedure Laterality Date  . TONSILLECTOMY      There were no vitals filed for this visit.  Subjective Assessment - 05/09/19 1350    Subjective   S: it was sore the night of the treatment session.    Currently in Pain?  Yes    Pain Score  2     Pain Location  Neck    Pain Orientation  Right    Pain Descriptors / Indicators  Burning    Pain Type  Acute pain    Pain Radiating Towards  radiates from neck down to shoulder    Pain Onset  1 to 4 weeks ago    Pain Frequency  Constant    Aggravating Factors   work related tasks, first treatment session    Pain Relieving Factors  over the counter pain medication    Effect of Pain on Daily Activities  patient pushes through the pain    Multiple Pain Sites  No         [facility] OT Assessment - 05/09/19 1352      Assessment   Medical Diagnosis  right shoulder and cervical pinch nerve      Precautions   Precautions  None               OT Treatments/Exercises (OP) - 05/09/19 1353      Exercises   Exercises  Shoulder       Shoulder Exercises: Prone   Other Prone Exercises  Bent over single arm Y and T; 2#; 12X      Shoulder Exercises: Sidelying   External Rotation  Strengthening;12 reps    External Rotation Weight (lbs)  3 with towel roll    Internal Rotation  Strengthening;12 reps    Internal Rotation Weight (lbs)  3 with towel roll    Flexion  Strengthening;12 reps    Flexion Weight (lbs)  2    ABduction  Strengthening;12 reps    ABduction Weight (lbs)  2    Other Sidelying Exercises  protraction; 2#; 12X    Other Sidelying Exercises  Horizontal abduction; 2#, 12X      Shoulder Exercises: Therapy Ball   Other Therapy Ball Exercises  green ball: chest press, flexion, circles, 12X      Shoulder Exercises: ROM/Strengthening   UBE (Upper Arm Bike)  Level 3 reverse only for 3'  pace: 6.0-7.0   Plank  3 reps   1: 15" 2 & 3: 20" each   Other ROM/Strengthening Exercises  Y arms lift off; 10X      Manual Therapy   Manual Therapy  Myofascial release    Manual therapy comments  Manual therapy completed prior to exercises.     Myofascial Release  Myofascial release completed to right upper trapezius and scapularis region to decrease fascial restrictions and allow for ROM in a pain free zone.                OT Short Term Goals - 05/07/19 1345      OT SHORT TERM GOAL #1   Title  Patient will be educated and independent with her HEP to decrease muscle tightness and pain and allow her to complete work related task with increased comfort.    Time  4    Period  Weeks    Status  On-going    Target Date  05/27/19      OT SHORT TERM GOAL #2   Title  Patient will decrease fascial retrictions to min amount or less in order decrease pain and increase ability to complete lifting and reach tasks while using her RUE.    Time  4    Period  Weeks    Status  On-going      OT SHORT TERM GOAL #3   Title  Patient will increase right middle and lower trapezius strength to 5/5 in order to complete work  related lifting tasks with increased stability and scapular stability.    Time  4    Period  Weeks    Status  On-going      OT SHORT TERM GOAL #4   Title  Patient will report a decrease of pain when using her RUE for daily and work related tasks of 3/10 on average in order to continously use her RUE during daily and work related tasks.    Time  4    Period  Weeks    Status  On-going               Plan - 05/09/19 1413    Clinical Impression Statement  A: Added plank holds and UBE bike to further work on increasing scapular stability and middle trapezius strength. Patient demonstrated mild muscle fatigue during session while taking appropriate rest breaks when needed. Min fascial restrictions noted in right cervical region at base of neck although less than at initial evaluation.    Body Structure / Function / Physical Skills  ADL;Fascial restriction;Strength;Pain    Plan  P: continue with scapular strengthening and plank hold attempting bird dogs also to further focus on stability and strength.    Consulted and Agree with Plan of Care  Patient       Patient will benefit from skilled therapeutic intervention in order to improve the following deficits and impairments:   Body Structure / Function / Physical Skills: ADL, Fascial restriction, Strength, Pain       Visit Diagnosis: 1. Acute pain of right shoulder   2. Other symptoms and signs involving the musculoskeletal system       Problem List There are no active problems to display for this patient.  Ailene Ravel, OTR/L,CBIS  [phone]  05/09/2019, 2:26 PM  [place] [address] [place], [place], [address] Phone: [phone]   Fax:  [phone]  Name: Caroline Good MRN: [id number] Date of Birth:  01/15/1996 

## 2019-05-14 ENCOUNTER — Ambulatory Visit (HOSPITAL_COMMUNITY): Payer: 59

## 2019-05-14 ENCOUNTER — Encounter (HOSPITAL_COMMUNITY): Payer: Self-pay

## 2019-05-14 ENCOUNTER — Other Ambulatory Visit: Payer: Self-pay

## 2019-05-14 DIAGNOSIS — M25511 Pain in right shoulder: Secondary | ICD-10-CM

## 2019-05-14 DIAGNOSIS — R29898 Other symptoms and signs involving the musculoskeletal system: Secondary | ICD-10-CM | POA: Diagnosis not present

## 2019-05-14 NOTE — Therapy (Signed)
[place], [place], [address] Phone: [phone]   Fax:  [phone]  Occupational Therapy Treatment  Patient Details  Name: DAYZEE TROWER MRN: [id number] Date of Birth: 1996/06/03 Referring Provider (OT): Dr. Almedia Balls   Encounter Date: 05/14/2019  OT End of Session - 05/14/19 1612    Visit Number  4    Number of Visits  8    Date for OT Re-Evaluation  05/27/19    Authorization Type  [employer]    Authorization Time Period  60 visit limit- 0 used. No auth required.    Authorization - Visit Number  4    Authorization - Number of Visits  13    OT Start Time  5809    OT Stop Time  1615    OT Time Calculation (min)  40 min    Activity Tolerance  Patient tolerated treatment well    Behavior During Therapy  WFL for tasks assessed/performed       Past Medical History:  Diagnosis Date  . Anxiety     Past Surgical History:  Procedure Laterality Date  . TONSILLECTOMY      There were no vitals filed for this visit.  Subjective Assessment - 05/14/19 1545    Subjective   S: It's more sore when I'm at work.    Currently in Pain?  Yes    Pain Score  2     Pain Location  Neck    Pain Orientation  Right    Pain Descriptors / Indicators  Sore    Pain Type  Acute pain         [facility] OT Assessment - 05/14/19 1546      Assessment   Medical Diagnosis  right shoulder and cervical pinch nerve      Precautions   Precautions  None               OT Treatments/Exercises (OP) - 05/14/19 1546      Exercises   Exercises  Shoulder      Shoulder Exercises: Prone   Other Prone Exercises  Bent over single arm Y and T; 2#; 15X      Shoulder Exercises: Sidelying   External Rotation  Strengthening;15 reps    External Rotation Weight (lbs)  3 with towel roll    Internal Rotation  Strengthening;15 reps    Internal Rotation Weight (lbs)  3 with towel roll    Flexion  Strengthening;15 reps    Flexion Weight (lbs)  2    ABduction  Strengthening;15 reps    ABduction Weight (lbs)  2    Other Sidelying Exercises  protraction; 2#; 15X    Other Sidelying Exercises  Horizontal abduction; 2#, 15X      Shoulder Exercises: ROM/Strengthening   UBE (Upper Arm Bike)  Level 3 reverse only for 3'   pace: 7.0-8.0   Plank  3 reps   20" hold   Other ROM/Strengthening Exercises  Red band loop: wall walks, wall lateral slide; 5X each   wall slides completed with BUE.   Other ROM/Strengthening Exercises  Bird dogs; 1 minute total while alternating legs/arms holding for 5 seconds each; slow and controlled.       Manual Therapy   Manual Therapy  Myofascial release    Manual therapy comments  Manual therapy completed prior to exercises.     Myofascial Release  Myofascial release completed to right upper trapezius and scapularis region to  decrease fascial restrictions and allow for ROM in a pain free zone.                OT Short Term Goals - 05/07/19 1345      OT SHORT TERM GOAL #1   Title  Patient will be educated and independent with her HEP to decrease muscle tightness and pain and allow her to complete work related task with increased comfort.    Time  4    Period  Weeks    Status  On-going    Target Date  05/27/19      OT SHORT TERM GOAL #2   Title  Patient will decrease fascial retrictions to min amount or less in order decrease pain and increase ability to complete lifting and reach tasks while using her RUE.    Time  4    Period  Weeks    Status  On-going      OT SHORT TERM GOAL #3   Title  Patient will increase right middle and lower trapezius strength to 5/5 in order to complete work related lifting tasks with increased stability and scapular stability.    Time  4    Period  Weeks    Status  On-going      OT SHORT TERM GOAL #4   Title  Patient will report a decrease of pain when using her RUE for daily and work related tasks of 3/10 on average in order to continously  use her RUE during daily and work related tasks.    Time  4    Period  Weeks    Status  On-going               Plan - 05/14/19 1612    Body Structure / Function / Physical Skills  ADL;Fascial restriction;Strength;Pain    Plan  P: Focus on core while integrating shoulder/scapular strength. Example: boat pose with feet on ground, side plank (bottom knee on ground), russion twist with feet on ground (hold 3 lb weight).    Consulted and Agree with Plan of Care  Patient       Patient will benefit from skilled therapeutic intervention in order to improve the following deficits and impairments:   Body Structure / Function / Physical Skills: ADL, Fascial restriction, Strength, Pain       Visit Diagnosis: 1. Acute pain of right shoulder   2. Other symptoms and signs involving the musculoskeletal system       Problem List There are no active problems to display for this patient.  Limmie PatriciaLaura Ginnifer Creelman, OTR/L,CBIS  [phone]  05/14/2019, 4:23 PM  [place] [facility] [address] [place], [place], [address] Phone: [phone]   Fax:  [phone]  Name: Fransico MeadowKatelynn L Cobb MRN: [id number] Date of Birth: November 12, 1995

## 2019-05-16 ENCOUNTER — Ambulatory Visit (HOSPITAL_COMMUNITY): Payer: 59

## 2019-05-16 ENCOUNTER — Other Ambulatory Visit: Payer: Self-pay

## 2019-05-16 ENCOUNTER — Encounter (HOSPITAL_COMMUNITY): Payer: Self-pay

## 2019-05-16 DIAGNOSIS — R29898 Other symptoms and signs involving the musculoskeletal system: Secondary | ICD-10-CM

## 2019-05-16 DIAGNOSIS — M25511 Pain in right shoulder: Secondary | ICD-10-CM

## 2019-05-16 NOTE — Therapy (Addendum)
[place], [place], [address] Phone: [phone]   Fax:  [phone]  Occupational Therapy Treatment  Patient Details  Name: Caroline Good MRN: [id number] Date of Birth: 08-22-1996 Referring Provider (OT): Dr. Almedia Balls   Encounter Date: 05/16/2019  OT End of Session - 05/16/19 1357    Visit Number  5    Number of Visits  8    Date for OT Re-Evaluation  05/27/19    Authorization Type  [employer]    Authorization Time Period  60 visit limit- 0 used. No auth required.    Authorization - Visit Number  5    Authorization - Number of Visits  44    OT Start Time  3785    OT Stop Time  1415    OT Time Calculation (min)  40 min    Activity Tolerance  Patient tolerated treatment well    Behavior During Therapy  WFL for tasks assessed/performed       Past Medical History:  Diagnosis Date  . Anxiety     Past Surgical History:  Procedure Laterality Date  . TONSILLECTOMY      There were no vitals filed for this visit.  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    Currently in Pain?  No/denies         [facility] OT Assessment - 05/16/19 1350      Assessment   Medical Diagnosis  right shoulder and cervical pinch nerve      Precautions   Precautions  None               OT Treatments/Exercises (OP) - 05/16/19 1350      Exercises   Exercises  Shoulder      Shoulder Exercises: Prone   Other Prone Exercises  Core strengthening completed each exercise for 20 seconds on and 20 seconds off. russian twist, side plank, side plank hip lift,straight leg lift, in and outs, single bent leg crunch, crunch with hands reaching over knees.       Shoulder Exercises: Sidelying   External Rotation  Strengthening;15 reps    External Rotation Weight (lbs)  3 with towel roll    Internal Rotation  Strengthening;15 reps    Internal Rotation  Weight (lbs)  3 with towel roll    Flexion  Strengthening;10 reps    Flexion Weight (lbs)  3    ABduction  Strengthening;10 reps    ABduction Weight (lbs)  3    Other Sidelying Exercises  protraction; 3#; 10X    Other Sidelying Exercises  Horizontal abduction; 3#, 10X      Shoulder Exercises: ROM/Strengthening   Other ROM/Strengthening Exercises  Red band loop: wall walks, wall lateral slide; 6X each   wall slides completed with BUE   Other ROM/Strengthening Exercises  Bird dogs; 1 minute total while alternating legs/arms holding for 5 seconds each; slow and controlled.       Manual Therapy   Manual Therapy  Myofascial release    Manual therapy comments  Manual therapy completed prior to exercises.     Myofascial Release  Myofascial release completed to right upper trapezius and scapularis region to decrease fascial restrictions and allow for ROM in a pain free zone.              OT Education - 05/16/19 1515  Education Details  core strengthening exercises    Person(s) Educated  Patient    Methods  Explanation;Demonstration;Verbal cues;Handout    Comprehension  Returned demonstration;Verbalized understanding       OT Short Term Goals - 05/07/19 1345      OT SHORT TERM GOAL #1   Title  Patient will be educated and independent with her HEP to decrease muscle tightness and pain and allow her to complete work related task with increased comfort.    Time  4    Period  Weeks    Status  On-going    Target Date  05/27/19      OT SHORT TERM GOAL #2   Title  Patient will decrease fascial retrictions to min amount or less in order decrease pain and increase ability to complete lifting and reach tasks while using her RUE.    Time  4    Period  Weeks    Status  On-going      OT SHORT TERM GOAL #3   Title  Patient will increase right middle and lower trapezius strength to 5/5 in order to complete work related lifting tasks with increased stability and scapular stability.     Time  4    Period  Weeks    Status  On-going      OT SHORT TERM GOAL #4   Title  Patient will report a decrease of pain when using her RUE for daily and work related tasks of 3/10 on average in order to continously use her RUE during daily and work related tasks.    Time  4    Period  Weeks    Status  On-going               Plan - 05/16/19 1434    Clinical Impression Statement  A: Focused on scapular strength and stability while adding core strength and stability to decrease anterior pelvic tilt while allowing her UB to be in better alignment. VC for form and technique. Manual techniques completed to address cervical fascial restrictions.    Body Structure / Function / Physical Skills  ADL;Fascial restriction;Strength;Pain    Plan  P: Continue with shoulder and scapular stability. Ad dbout pose with feet on ground arms extended. Hold 3lb weight during russian twist       Patient will benefit from skilled therapeutic intervention in order to improve the following deficits and impairments:   Body Structure / Function / Physical Skills: ADL, Fascial restriction, Strength, Pain       Visit Diagnosis: 1. Acute pain of right shoulder   2. Other symptoms and signs involving the musculoskeletal system       Problem List There are no active problems to display for this patient.  Caroline Good, OTR/L,CBIS  [phone]  05/16/2019, 3:16 PM  [place] [facility] [address] [place], [place], [address] Phone: [phone]   Fax:  [phone]  Name: Caroline Good MRN: [id number] Date of Birth: 05-Jan-1996

## 2019-05-16 NOTE — Patient Instructions (Signed)
Strength: [place] Twist  Position: Sitting with hips and knees bent, feet on the ground. Try to maintain a 45 degree angle between torso and ground.   Twist to one side and touch the the floor. Twist to the other side and do the same.  Complete as many reps as possible during the prescribed time period.  To increase difficulty, use a weight/medicine ball or twist slowly to each side in a controlled manner. Complete once a day. 1 set. 10 reps.    SUPINE BRACE - DOUBLE LEG LIFT  While lying on your back, place your hands under your buttocks. Tilt your pelvis so that your lower back moves towards the floor and hold this position.  Next, raise your legs up in the air one at a time and hold this position. Maintain your lower spine held towards the floor the entire time.   If you cannot control your spine downward, then try bending your knees as you raise them up and hold. If you lose control of your spine then the exercise is too advanced.   Complete once a day. 1 set 10 reps    Side Plank  Maintain Position   Complete on each side.   Complete once a day. 1 set each side. Hold for 20 seconds.   Side Plank with Hip Lift  Side Plank with Hip Lift     Complete on each side.   Complete once a day. 1 set each side. Hold for 20 seconds.

## 2019-05-20 ENCOUNTER — Other Ambulatory Visit: Payer: Self-pay

## 2019-05-20 ENCOUNTER — Ambulatory Visit (HOSPITAL_COMMUNITY): Payer: 59

## 2019-05-20 ENCOUNTER — Encounter (HOSPITAL_COMMUNITY): Payer: Self-pay

## 2019-05-20 DIAGNOSIS — M25511 Pain in right shoulder: Secondary | ICD-10-CM

## 2019-05-20 DIAGNOSIS — R29898 Other symptoms and signs involving the musculoskeletal system: Secondary | ICD-10-CM

## 2019-05-20 NOTE — Therapy (Signed)
[place], [place], [address] Phone: [phone]   Fax:  [phone]  Occupational Therapy Treatment  Patient Details  Name: Caroline Good MRN: [id number] Date of Birth: 04-16-1996 Referring Provider (OT): Dr. Almedia Balls   Encounter Date: 05/20/2019  OT End of Session - 05/20/19 1257    Visit Number  6    Number of Visits  8    Date for OT Re-Evaluation  05/27/19    Authorization Type  [employer]    Authorization Time Period  60 visit limit- 0 used. No auth required.    Authorization - Visit Number  6    Authorization - Number of Visits  41    OT Start Time  1220    OT Stop Time  1300    OT Time Calculation (min)  40 min    Activity Tolerance  Patient tolerated treatment well    Behavior During Therapy  WFL for tasks assessed/performed       Past Medical History:  Diagnosis Date  . Anxiety     Past Surgical History:  Procedure Laterality Date  . TONSILLECTOMY      There were no vitals filed for this visit.  Subjective Assessment - 05/20/19 1229    Subjective   S: It doesn't hurt today and it didn't yesterday. I'm off both days. I do notice that it doesn't hurt as bad when it does hurt.    Currently in Pain?  No/denies         [facility] OT Assessment - 05/20/19 1230      Assessment   Medical Diagnosis  right shoulder and cervical pinch nerve      Precautions   Precautions  None               OT Treatments/Exercises (OP) - 05/20/19 1230      Exercises   Exercises  Shoulder      Shoulder Exercises: Prone   Other Prone Exercises  Core strengthening: Russian twist with 3# weight, in/outs, boat hold with 3# weight in front, boat post with 3# held overhead. 1 time through. 20 seconds on 20 seconds off      Shoulder Exercises: Sidelying   External Rotation  Strengthening;15 reps    External Rotation Weight (lbs)  3 with towel roll    Internal Rotation  Strengthening;15 reps     Internal Rotation Weight (lbs)  3 with towel roll    Flexion  Strengthening;10 reps    Flexion Weight (lbs)  3    ABduction  Strengthening;10 reps    ABduction Weight (lbs)  3    Other Sidelying Exercises  protraction; 3#; 12X    Other Sidelying Exercises  Horizontal abduction; 3#, 12X      Shoulder Exercises: ROM/Strengthening   UBE (Upper Arm Bike)  Level 3 reverse only for 3'   pace: 9.0-10.0   Plank  3 reps   20"   X to V Arms  10X with 3#    Other ROM/Strengthening Exercises  Bodycraft pulleys: Protraction, horizontal abduction, PNF diagonal (4X with 20#, finished with 10#), external rotation; 20#, 10X     Other ROM/Strengthening Exercises  Bird dogs; 1 minute total while alternating legs/arms holding for 5 seconds each; slow and controlled.       Shoulder Exercises: Stretch   Other Shoulder Stretches  --      Manual Therapy   Manual Therapy  Myofascial release  Manual therapy comments  Manual therapy completed prior to exercises.     Myofascial Release  Myofascial release completed to right upper trapezius and scapularis region to decrease fascial restrictions and allow for ROM in a pain free zone.                OT Short Term Goals - 05/07/19 1345      OT SHORT TERM GOAL #1   Title  Patient will be educated and independent with her HEP to decrease muscle tightness and pain and allow her to complete work related task with increased comfort.    Time  4    Period  Weeks    Status  On-going    Target Date  05/27/19      OT SHORT TERM GOAL #2   Title  Patient will decrease fascial retrictions to min amount or less in order decrease pain and increase ability to complete lifting and reach tasks while using her RUE.    Time  4    Period  Weeks    Status  On-going      OT SHORT TERM GOAL #3   Title  Patient will increase right middle and lower trapezius strength to 5/5 in order to complete work related lifting tasks with increased stability and scapular  stability.    Time  4    Period  Weeks    Status  On-going      OT SHORT TERM GOAL #4   Title  Patient will report a decrease of pain when using her RUE for daily and work related tasks of 3/10 on average in order to continously use her RUE during daily and work related tasks.    Time  4    Period  Weeks    Status  On-going               Plan - 05/20/19 1316    Clinical Impression Statement  A: Continued to focus on core strengthening in addition to scapular and shoulder stability during session. Progressed core strengthening using 3# hand weight and added bodycraft pulleys for better scapular control and stability. VC for form and technique. Less fascial restrictions noted this date in right upper trapezius and lateral cervical region requiring less time for manual techniques to address.    Body Structure / Function / Physical Skills  ADL;Fascial restriction;Strength;Pain    Plan  P: Continue with soulder and scapular stability. Resume red therband loop exercises. Continue with bodycraft pulleys.    Consulted and Agree with Plan of Care  Patient       Patient will benefit from skilled therapeutic intervention in order to improve the following deficits and impairments:   Body Structure / Function / Physical Skills: ADL, Fascial restriction, Strength, Pain       Visit Diagnosis: 1. Acute pain of right shoulder   2. Other symptoms and signs involving the musculoskeletal system       Problem List There are no active problems to display for this patient.  Limmie PatriciaLaura Eileen Croswell, OTR/L,CBIS  [phone]  05/20/2019, 1:19 PM  [place] [facility] [address] [place], [place], [address] Phone: [phone]   Fax:  [phone]  Name: Caroline Good MRN: [id number] Date of Birth: [birth year]

## 2019-05-23 ENCOUNTER — Other Ambulatory Visit: Payer: Self-pay

## 2019-05-23 ENCOUNTER — Encounter (HOSPITAL_COMMUNITY): Payer: Self-pay | Admitting: Occupational Therapy

## 2019-05-23 ENCOUNTER — Ambulatory Visit (HOSPITAL_COMMUNITY): Payer: 59 | Admitting: Occupational Therapy

## 2019-05-23 DIAGNOSIS — R29898 Other symptoms and signs involving the musculoskeletal system: Secondary | ICD-10-CM | POA: Diagnosis not present

## 2019-05-23 DIAGNOSIS — M25511 Pain in right shoulder: Secondary | ICD-10-CM

## 2019-05-23 NOTE — Therapy (Signed)
[facility] [address] [place], [place], [address] Phone: [phone]   Fax:  [phone]  Occupational Therapy Treatment  Patient Details  Name: Caroline Good MRN: [id number] Date of Birth: [birth year] Referring Provider (OT): Dr. Lunette StandsAnna Voytek   Encounter Date: 05/23/2019  OT End of Session - 05/23/19 1100    Visit Number  7    Number of Visits  8    Date for OT Re-Evaluation  05/27/19    Authorization Type  [employer]    Authorization Time Period  60 visit limit- 0 used. No auth required.    Authorization - Visit Number  7    Authorization - Number of Visits  60    OT Start Time  1017    OT Stop Time  1100    OT Time Calculation (min)  43 min    Activity Tolerance  Patient tolerated treatment well    Behavior During Therapy  WFL for tasks assessed/performed       Past Medical History:  Diagnosis Date  . Anxiety     Past Surgical History:  Procedure Laterality Date  . TONSILLECTOMY      There were no vitals filed for this visit.  Subjective Assessment - 05/23/19 1017    Subjective   S: I've been off the past few days so it's not bothered me too much.    Currently in Pain?  No/denies         [facility] - 05/23/19 1017      Assessment   Medical Diagnosis  right shoulder and cervical pinch nerve      Precautions   Precautions  None               OT Treatments/Exercises (OP) - 05/23/19 1017      Exercises   Exercises  Shoulder      Shoulder Exercises: Prone   Other Prone Exercises  Core strengthening: Russian twist with 3# weight, in/outs, boat hold with 3# weight in front, boat post with 3# held overhead. 1 time through. 20 seconds on 20 seconds off      Shoulder Exercises: Sidelying   External Rotation  Strengthening;15 reps    External Rotation Weight (lbs)  3 with towel roll    Internal Rotation  Strengthening;15 reps    Internal Rotation Weight (lbs)  3 with towel roll    Flexion  Strengthening;10 reps    Flexion Weight (lbs)  3    ABduction  Strengthening;10 reps    ABduction Weight (lbs)  3    Other Sidelying Exercises  protraction; 3#; 15X    Other Sidelying Exercises  Horizontal abduction; 3#, 12X      Shoulder Exercises: ROM/Strengthening   Plank  3 reps   20"   Other ROM/Strengthening Exercises  Bodycraft pulleys: Protraction 20#, horizontal abduction 10#, PNF diagonal 20#, external rotation 10#, 10X; Red loop band: lateral wall slides, diagonal wall slides (up), wall walks, 10X each    Other ROM/Strengthening Exercises  Bird dogs; 1 minute total while alternating legs/arms holding for 5 seconds each; slow and controlled.       Manual Therapy   Manual Therapy  Myofascial release    Manual therapy comments  Manual therapy completed prior to exercises.     Myofascial Release  Myofascial release completed to right upper trapezius and scapularis region to decrease fascial restrictions and allow for ROM in a pain free zone.  OT Short Term Goals - 05/07/19 1345      OT SHORT TERM GOAL #1   Title  Patient will be educated and independent with her HEP to decrease muscle tightness and pain and allow her to complete work related task with increased comfort.    Time  4    Period  Weeks    Status  On-going    Target Date  05/27/19      OT SHORT TERM GOAL #2   Title  Patient will decrease fascial retrictions to min amount or less in order decrease pain and increase ability to complete lifting and reach tasks while using her RUE.    Time  4    Period  Weeks    Status  On-going      OT SHORT TERM GOAL #3   Title  Patient will increase right middle and lower trapezius strength to 5/5 in order to complete work related lifting tasks with increased stability and scapular stability.    Time  4    Period  Weeks    Status  On-going      OT SHORT TERM GOAL #4   Title  Patient will report a decrease of pain when using her RUE for daily  and work related tasks of 3/10 on average in order to continously use her RUE during daily and work related tasks.    Time  4    Period  Weeks    Status  On-going               Plan - 05/23/19 1100    Clinical Impression Statement  A: Continued with scapular and shoulder stability incorporating core strengthening. Resumed red loop band stability exercises today and continued with bodycraft pulley exercises. Pt requiring weight to be decreased to 10# for er today. Verbal cuing for form and technique.    Body Structure / Function / Physical Skills  ADL;Fascial restriction;Strength;Pain    Plan  P: Reassessment, determine discharge vs recertification       Patient will benefit from skilled therapeutic intervention in order to improve the following deficits and impairments:   Body Structure / Function / Physical Skills: ADL, Fascial restriction, Strength, Pain       Visit Diagnosis: 1. Acute pain of right shoulder   2. Other symptoms and signs involving the musculoskeletal system       Problem List There are no active problems to display for this patient.  Guadelupe Sabin, OTR/L  [phone] 05/23/2019, 11:02 AM  [place], [place], [address] Phone: [phone]   Fax:  [phone]  Name: Caroline Good MRN: [id number] Date of Birth: 08/02/1996

## 2019-05-27 ENCOUNTER — Ambulatory Visit (HOSPITAL_COMMUNITY): Payer: 59

## 2019-05-27 ENCOUNTER — Other Ambulatory Visit: Payer: Self-pay

## 2019-05-27 ENCOUNTER — Encounter (HOSPITAL_COMMUNITY): Payer: Self-pay

## 2019-05-27 DIAGNOSIS — R29898 Other symptoms and signs involving the musculoskeletal system: Secondary | ICD-10-CM | POA: Diagnosis not present

## 2019-05-27 DIAGNOSIS — M25511 Pain in right shoulder: Secondary | ICD-10-CM

## 2019-05-27 NOTE — Therapy (Signed)
[place], [place], [address] Phone: [phone]   Fax:  [phone]  Occupational Therapy Treatment Reassessment/re-cert Patient Details  Name: Caroline Good MRN: [id number] Date of Birth: January 07, 1996 Referring Provider (OT): Dr. Almedia Balls   Encounter Date: 05/27/2019  OT End of Session - 05/27/19 1355    Visit Number  8    Number of Visits  9    Date for OT Re-Evaluation  05/31/19    Authorization Type  [employer]    Authorization Time Period  60 visit limit- 0 used. No auth required.    Authorization - Visit Number  8    Authorization - Number of Visits  60    OT Start Time  1339   reassessment   OT Stop Time  6837    OT Time Calculation (min)  39 min    Activity Tolerance  Patient tolerated treatment well    Behavior During Therapy  WFL for tasks assessed/performed       Past Medical History:  Diagnosis Date  . Anxiety     Past Surgical History:  Procedure Laterality Date  . TONSILLECTOMY      There were no vitals filed for this visit.  Subjective Assessment - 05/27/19 1353    Subjective   S: It's not hurting as much as it used to before i started therapy.    Currently in Pain?  No/denies    Pain Score  0-No pain         [facility] OT Assessment - 05/27/19 1340      Assessment   Medical Diagnosis  right shoulder and cervical pinch nerve      Precautions   Precautions  None      Palpation   Palpation comment  Minimal fascial restrictions in right upper trapezius region.       AROM   Overall AROM   Within functional limits for tasks performed      PROM   Overall PROM   Within functional limits for tasks performed      Strength   Overall Strength Comments  Shoulder ranges: 5/5 Middle and lower trapezius: 4+/5 (eval: 4/5)                OT Treatments/Exercises (OP) - 05/27/19 1354      Exercises   Exercises  Shoulder      Shoulder Exercises: Sidelying    External Rotation  Strengthening;15 reps    External Rotation Weight (lbs)  3 with towel roll    Internal Rotation  Strengthening;15 reps    Internal Rotation Weight (lbs)  3 with towel roll    Flexion  Strengthening;15 reps    Flexion Weight (lbs)  3    ABduction  Strengthening;15 reps    ABduction Weight (lbs)  3    Other Sidelying Exercises  protraction; 3#; 15X    Other Sidelying Exercises  Horizontal abduction; 3#, 15X      Shoulder Exercises: Standing   Other Standing Exercises  Red band loop: wall slide with Y arms lift off; 10X. Wall walks 8X up. Wall clock (2,3,4 o'clock) 8X each       Shoulder Exercises: ROM/Strengthening   UBE (Upper Arm Bike)  Level 4 3' reverse only   pace: 9.0-10.0   Plank  3 reps   30" hold   X to V Arms  12X with 3#  OT Short Term Goals - 05/27/19 1348      OT SHORT TERM GOAL #1   Title  Patient will be educated and independent with her HEP to decrease muscle tightness and pain and allow her to complete work related task with increased comfort.    Time  4    Period  Weeks    Status  Achieved    Target Date  05/27/19      OT SHORT TERM GOAL #2   Title  Patient will decrease fascial retrictions to min amount or less in order decrease pain and increase ability to complete lifting and reach tasks while using her RUE.    Time  4    Period  Weeks    Status  Achieved      OT SHORT TERM GOAL #3   Title  Patient will increase right middle and lower trapezius strength to 5/5 in order to complete work related lifting tasks with increased stability and scapular stability.    Baseline  7/27: middle and lower trapezius: 4+/5    Time  4    Period  Weeks    Status  Partially Met      OT SHORT TERM GOAL #4   Title  Patient will report a decrease of pain when using her RUE for daily and work related tasks of 3/10 on average in order to continously use her RUE during daily and work related tasks.    Baseline  7/27: pt reports an  average 5/10 pain level.    Time  4    Period  Weeks    Status  Partially Met               Plan - 05/27/19 1357    Clinical Impression Statement  A: Reassessment completed this date. patient reports that her pain was 8/10 prior to starting therapy and now she experiences pain more consistantly at 5/10. She has shown an increase in her middle trapezius strength which in turn has provided her with better shoulder and scapular stability with increased posture during sitting and standing activities. With manual techniques in clinic and independently, patient has reduced her trigger points and fascial retrictions to a minimal amount. Pt wishes to finish last scheduled therapy session this week before discharging home with HEP.    Body Structure / Function / Physical Skills  ADL;Fascial restriction;Strength;Pain    OT Frequency  --   1 more visit   OT Duration  --   1 week.   Plan  P: Complete one more therapy session prior to discharge to focus on mentioned deficits. Discharge next session with appropriate HEP. Follow up with MD as needed. Complete normal treatment session while reviewing HEP and making any changes that are needed.    Consulted and Agree with Plan of Care  Patient       Patient will benefit from skilled therapeutic intervention in order to improve the following deficits and impairments:   Body Structure / Function / Physical Skills: ADL, Fascial restriction, Strength, Pain       Visit Diagnosis: 1. Acute pain of right shoulder   2. Other symptoms and signs involving the musculoskeletal system       Problem List There are no active problems to display for this patient.  Ailene Ravel, OTR/L,CBIS  [phone]  05/27/2019, 2:27 PM  [place] [address] [place], [place], [address] Phone: [phone]   Fax:  [phone]  Name:  Caroline Good MRN: [id number] Date of Birth: Apr 07, 1996

## 2019-05-29 ENCOUNTER — Other Ambulatory Visit: Payer: Self-pay

## 2019-05-29 ENCOUNTER — Other Ambulatory Visit: Payer: 59

## 2019-05-29 DIAGNOSIS — Z20822 Contact with and (suspected) exposure to covid-19: Secondary | ICD-10-CM

## 2019-05-30 ENCOUNTER — Ambulatory Visit (HOSPITAL_COMMUNITY): Payer: 59 | Admitting: Occupational Therapy

## 2019-05-30 ENCOUNTER — Telehealth (HOSPITAL_COMMUNITY): Payer: Self-pay | Admitting: Family Medicine

## 2019-05-30 NOTE — Telephone Encounter (Signed)
7/30/  848am  left patient a message to cx today's appt... does she want to reschedule for 1 more appt or she can be discharged... she needs to let us know what she would like to do

## 2019-05-30 NOTE — Telephone Encounter (Signed)
05/30/19  pt tested for COVID.Marland KitchenMarland Kitchen left a message for her to call us so we could see if she wanted to reschedule for 1 more appt or be discharged

## 2019-05-31 LAB — NOVEL CORONAVIRUS, NAA: SARS-CoV-2, NAA: DETECTED — AB

## 2019-07-11 ENCOUNTER — Encounter (HOSPITAL_COMMUNITY): Payer: Self-pay

## 2019-07-11 NOTE — Therapy (Signed)
[place], [place], [address] Phone: [phone]   Fax:  [phone]  Patient Details  Name: Caroline Good MRN: [id number] Date of Birth: [birth year] Referring Provider:  No ref. provider found  Encounter Date: 07/11/2019  OCCUPATIONAL THERAPY DISCHARGE SUMMARY  Visits from Start of Care: 8  Current functional level related to goals / functional outcomes: OT SHORT TERM GOAL #1    Title  Patient will be educated and independent with her HEP to decrease muscle tightness and pain and allow her to complete work related task with increased comfort.    Time  4    Period  Weeks    Status  Achieved    Target Date  05/27/19        OT SHORT TERM GOAL #2   Title  Patient will decrease fascial retrictions to min amount or less in order decrease pain and increase ability to complete lifting and reach tasks while using her RUE.    Time  4    Period  Weeks    Status  Achieved        OT SHORT TERM GOAL #3   Title  Patient will increase right middle and lower trapezius strength to 5/5 in order to complete work related lifting tasks with increased stability and scapular stability.    Baseline  7/27: middle and lower trapezius: 4+/5    Time  4    Period  Weeks    Status  Partially Met        OT SHORT TERM GOAL #4   Title  Patient will report a decrease of pain when using her RUE for daily and work related tasks of 3/10 on average in order to continously use her RUE during daily and work related tasks.    Baseline  7/27: pt reports an average 5/10 pain level.    Time  4    Period  Weeks    Status  Partially Met       Remaining deficits: Pt did not present to last treatment session for reassessment due to COVID testing. Unknown if patient has met goals although based on functional performance during sessions she has met all goals.    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  Patient goals were met. Patient is being discharged due to meeting the stated rehab goals.  ?????         Ailene Ravel, OTR/L,CBIS  [phone]  07/11/2019, 4:05 PM  [facility] [address] [place], [place], [address] Phone: [phone]   Fax:  [phone]

## 2019-10-29 ENCOUNTER — Ambulatory Visit (INDEPENDENT_AMBULATORY_CARE_PROVIDER_SITE_OTHER): Payer: Managed Care, Other (non HMO) | Admitting: *Deleted

## 2019-10-29 ENCOUNTER — Encounter: Payer: Self-pay | Admitting: *Deleted

## 2019-10-29 ENCOUNTER — Other Ambulatory Visit: Payer: Self-pay

## 2019-10-29 VITALS — BP 130/73 | HR 105 | Ht 64.0 in | Wt 117.0 lb

## 2019-10-29 DIAGNOSIS — Z3201 Encounter for pregnancy test, result positive: Secondary | ICD-10-CM | POA: Diagnosis not present

## 2019-10-29 LAB — POCT URINE PREGNANCY: Preg Test, Ur: POSITIVE — AB

## 2019-10-29 NOTE — Progress Notes (Signed)
Chart reviewed for nurse visit. Agree with plan of care.  Estill Dooms, NP 10/29/2019 1:46 PM

## 2019-10-29 NOTE — Progress Notes (Signed)
   NURSE VISIT- PREGNANCY CONFIRMATION   SUBJECTIVE:  Caroline Good is a 23 y.o. G1P0000 female at [redacted]w[redacted]d by certain LMP of Patient's last menstrual period was 09/28/2019 (exact date). Here for pregnancy confirmation.  Home pregnancy test: positive x 1  She reports nausea.  She is not taking prenatal vitamins.    OBJECTIVE:  BP 130/73 (BP Location: Left Arm, Patient Position: Sitting, Cuff Size: Normal)   Pulse (!) 105   Ht 5\' 4"  (1.626 m)   Wt 117 lb (53.1 kg)   LMP 09/28/2019 (Exact Date)   BMI 20.08 kg/m   Appears well, in no apparent distress OB History  Gravida Para Term Preterm AB Living  1 0 0 0 0 0  SAB TAB Ectopic Multiple Live Births  0 0 0 0 0    # Outcome Date GA Lbr Len/2nd Weight Sex Delivery Anes PTL Lv  1 Current             Results for orders placed or performed in visit on 10/29/19 (from the past 24 hour(s))  POCT urine pregnancy   Collection Time: 10/29/19 11:25 AM  Result Value Ref Range   Preg Test, Ur Positive (A) Negative    ASSESSMENT: Positive pregnancy test, [redacted]w[redacted]d by LMP    PLAN: Schedule for dating ultrasound in 3 weeks Prenatal vitamins: plans to begin OTC ASAP   Nausea medicines: not currently needed   OB packet given: Yes  Alice Rieger  10/29/2019 11:25 AM

## 2019-11-18 ENCOUNTER — Other Ambulatory Visit: Payer: Self-pay | Admitting: Obstetrics and Gynecology

## 2019-11-18 DIAGNOSIS — O3680X0 Pregnancy with inconclusive fetal viability, not applicable or unspecified: Secondary | ICD-10-CM

## 2019-11-19 ENCOUNTER — Other Ambulatory Visit: Payer: Self-pay

## 2019-11-19 ENCOUNTER — Other Ambulatory Visit: Payer: Self-pay | Admitting: Obstetrics and Gynecology

## 2019-11-19 ENCOUNTER — Other Ambulatory Visit: Payer: Managed Care, Other (non HMO)

## 2019-11-19 ENCOUNTER — Ambulatory Visit (INDEPENDENT_AMBULATORY_CARE_PROVIDER_SITE_OTHER): Payer: Managed Care, Other (non HMO)

## 2019-11-19 DIAGNOSIS — Z3A01 Less than 8 weeks gestation of pregnancy: Secondary | ICD-10-CM | POA: Diagnosis not present

## 2019-11-19 DIAGNOSIS — Z3201 Encounter for pregnancy test, result positive: Secondary | ICD-10-CM

## 2019-11-19 DIAGNOSIS — O3680X0 Pregnancy with inconclusive fetal viability, not applicable or unspecified: Secondary | ICD-10-CM

## 2019-11-19 NOTE — Progress Notes (Signed)
[place] 6+5 wks GS,no YS or fetal pole visualized,GS 18.4 mm,GS sac is located mid/LUS,normal ovaries,pt will have labs and f/u ultrasound in two weeks per Victorino Dike

## 2019-11-20 ENCOUNTER — Telehealth: Payer: Self-pay | Admitting: Adult Health

## 2019-11-20 ENCOUNTER — Telehealth: Payer: Self-pay | Admitting: *Deleted

## 2019-11-20 LAB — BETA HCG QUANT (REF LAB): hCG Quant: 18030 m[IU]/mL

## 2019-11-20 LAB — PROGESTERONE: Progesterone: 6.9 ng/mL

## 2019-11-20 MED ORDER — PROGESTERONE MICRONIZED 200 MG PO CAPS: 200.0000 mg | ORAL_CAPSULE | Freq: Every day | ORAL | 3 refills | Status: DC

## 2019-11-20 MED ORDER — PROGESTERONE MICRONIZED 200 MG PO CAPS: ORAL_CAPSULE | ORAL | 3 refills | Status: DC

## 2019-11-20 NOTE — Telephone Encounter (Signed)
Mail box is full, QHCG places her at 6-7 weeks and has low progesterone level, will supplement with Prometrium

## 2019-11-20 NOTE — Telephone Encounter (Signed)
Mailbox is full.

## 2019-11-20 NOTE — Telephone Encounter (Signed)
Pt left  Message that she sees her results in mychart but doesn't understand.

## 2019-11-25 ENCOUNTER — Telehealth: Payer: Self-pay | Admitting: *Deleted

## 2019-11-25 MED ORDER — PROMETHAZINE HCL 25 MG PO TABS: 25.0000 mg | ORAL_TABLET | Freq: Four times a day (QID) | ORAL | 1 refills | Status: DC | PRN

## 2019-11-25 NOTE — Telephone Encounter (Signed)
Will rx phenergan  

## 2019-11-25 NOTE — Addendum Note (Signed)
Addended by: Cyril Mourning A on: 11/25/2019 05:01 PM   Modules accepted: Orders

## 2019-11-25 NOTE — Telephone Encounter (Signed)
Pt requesting rx for nausea. She is having a lot of nausea and vomiting.

## 2019-12-06 ENCOUNTER — Other Ambulatory Visit: Payer: Self-pay | Admitting: Adult Health

## 2019-12-06 DIAGNOSIS — O3680X0 Pregnancy with inconclusive fetal viability, not applicable or unspecified: Secondary | ICD-10-CM

## 2019-12-09 ENCOUNTER — Encounter: Payer: Self-pay | Admitting: Adult Health

## 2019-12-09 ENCOUNTER — Ambulatory Visit (INDEPENDENT_AMBULATORY_CARE_PROVIDER_SITE_OTHER): Payer: 59 | Admitting: Adult Health

## 2019-12-09 ENCOUNTER — Encounter: Payer: Managed Care, Other (non HMO) | Admitting: Adult Health

## 2019-12-09 ENCOUNTER — Ambulatory Visit (INDEPENDENT_AMBULATORY_CARE_PROVIDER_SITE_OTHER): Payer: 59

## 2019-12-09 ENCOUNTER — Other Ambulatory Visit: Payer: Self-pay | Admitting: Adult Health

## 2019-12-09 ENCOUNTER — Other Ambulatory Visit: Payer: Self-pay

## 2019-12-09 VITALS — BP 118/69 | HR 100 | Ht 64.0 in | Wt 116.0 lb

## 2019-12-09 DIAGNOSIS — O039 Complete or unspecified spontaneous abortion without complication: Secondary | ICD-10-CM | POA: Diagnosis not present

## 2019-12-09 DIAGNOSIS — Z3A10 10 weeks gestation of pregnancy: Secondary | ICD-10-CM

## 2019-12-09 DIAGNOSIS — O3680X0 Pregnancy with inconclusive fetal viability, not applicable or unspecified: Secondary | ICD-10-CM

## 2019-12-09 NOTE — Progress Notes (Signed)
  Subjective:     Patient ID: Caroline Good, female   DOB: 1996-09-24, 24 y.o.   MRN: [id number]  HPI Caroline Good is a 24 year old white female,single in for follow up [place].She was less than dates, and no fetal pole with lst [place] and had low progesterone level. PCP is Dr Hilma Favors.  Review of Systems Has been bleeding for 3-4 days now Reviewed past medical,surgical, social and family history. Reviewed medications and allergies.     Objective:   Physical Exam BP 118/69 (BP Location: Left Arm, Patient Position: Sitting, Cuff Size: Normal)   Pulse 100   Ht '5\' 4"'$  (1.626 m)   Wt 116 lb (52.6 kg)   LMP 09/28/2019 (Exact Date)   BMI 19.91 kg/m Fall risk is low Talk only:US showed 8 week GS, 29.3 mm, no fetal pole or YS Discussed options of expectant or medical management, and she will talk with boyfriend and let me know.    Assessment:     1. Miscarriage Will talk in am     Plan:     Return in 1 week, will check [facility] and [facility]

## 2019-12-09 NOTE — Progress Notes (Signed)
[place] 8 wks GS,no ys or fetal pole,GS 29.3 mm,normal ovaries

## 2019-12-10 NOTE — Progress Notes (Signed)
This encounter was created in error - please disregard.

## 2019-12-15 ENCOUNTER — Emergency Department (HOSPITAL_COMMUNITY)
Admission: EM | Admit: 2019-12-15 | Discharge: 2019-12-15 | Disposition: A | Discharge status: Home / Self Care | Payer: 59 | Attending: Emergency Medicine | Admitting: Emergency Medicine

## 2019-12-15 ENCOUNTER — Encounter (HOSPITAL_COMMUNITY): Payer: Self-pay | Admitting: *Deleted

## 2019-12-15 ENCOUNTER — Other Ambulatory Visit: Payer: Self-pay

## 2019-12-15 DIAGNOSIS — N939 Abnormal uterine and vaginal bleeding, unspecified: Secondary | ICD-10-CM | POA: Insufficient documentation

## 2019-12-15 DIAGNOSIS — O039 Complete or unspecified spontaneous abortion without complication: Secondary | ICD-10-CM

## 2019-12-15 DIAGNOSIS — Z8759 Personal history of other complications of pregnancy, childbirth and the puerperium: Secondary | ICD-10-CM | POA: Insufficient documentation

## 2019-12-15 DIAGNOSIS — R102 Pelvic and perineal pain: Secondary | ICD-10-CM | POA: Diagnosis not present

## 2019-12-15 HISTORY — DX: Complete or unspecified spontaneous abortion without complication: O03.9

## 2019-12-15 LAB — CBC WITH DIFFERENTIAL/PLATELET
Abs Immature Granulocytes: 0.04 10*3/uL (ref 0.00–0.07)
Basophils Absolute: 0 10*3/uL (ref 0.0–0.1)
Basophils Relative: 0 %
Eosinophils Absolute: 0.1 10*3/uL (ref 0.0–0.5)
Eosinophils Relative: 1 %
HCT: 27.6 % — ABNORMAL LOW (ref 36.0–46.0)
Hemoglobin: 9.3 g/dL — ABNORMAL LOW (ref 12.0–15.0)
Immature Granulocytes: 1 %
Lymphocytes Relative: 17 %
Lymphs Abs: 1.5 10*3/uL (ref 0.7–4.0)
MCH: 32.6 pg (ref 26.0–34.0)
MCHC: 33.7 g/dL (ref 30.0–36.0)
MCV: 96.8 fL (ref 80.0–100.0)
Monocytes Absolute: 0.5 10*3/uL (ref 0.1–1.0)
Monocytes Relative: 6 %
Neutro Abs: 6.5 10*3/uL (ref 1.7–7.7)
Neutrophils Relative %: 75 %
Platelets: 181 10*3/uL (ref 150–400)
RBC: 2.85 MIL/uL — ABNORMAL LOW (ref 3.87–5.11)
RDW: 11.9 % (ref 11.5–15.5)
WBC: 8.7 10*3/uL (ref 4.0–10.5)
nRBC: 0 % (ref 0.0–0.2)

## 2019-12-15 LAB — BASIC METABOLIC PANEL
Anion gap: 8 (ref 5–15)
BUN: 6 mg/dL (ref 6–20)
CO2: 24 mmol/L (ref 22–32)
Calcium: 9 mg/dL (ref 8.9–10.3)
Chloride: 107 mmol/L (ref 98–111)
Creatinine, Ser: 0.46 mg/dL (ref 0.44–1.00)
GFR calc Af Amer: >60 mL/min (ref >60)
GFR calc non Af Amer: >60 mL/min (ref >60)
Glucose, Bld: 96 mg/dL (ref 70–99)
Potassium: 4 mmol/L (ref 3.5–5.1)
Sodium: 139 mmol/L (ref 135–145)

## 2019-12-15 LAB — HEMOGLOBIN AND HEMATOCRIT, BLOOD
HCT: 27.2 % — ABNORMAL LOW (ref 36.0–46.0)
Hemoglobin: 9.1 g/dL — ABNORMAL LOW (ref 12.0–15.0)

## 2019-12-15 LAB — HCG, QUANTITATIVE, PREGNANCY: hCG, Beta Chain, Quant, S: 4141 m[IU]/mL — ABNORMAL HIGH (ref <5)

## 2019-12-15 LAB — ABO/RH: ABO/RH(D): O POS

## 2019-12-15 MED ORDER — HYDROCODONE-ACETAMINOPHEN 5-325 MG PO TABS: 1.0000 | ORAL_TABLET | Freq: Four times a day (QID) | ORAL | 0 refills | Status: DC | PRN

## 2019-12-15 MED ORDER — HYDROCODONE-ACETAMINOPHEN 5-325 MG PO TABS
1.0000 | ORAL_TABLET | Freq: Once | ORAL | Status: AC
  Administered 2019-12-15: 1 via ORAL
  Filled 2019-12-15: qty 1

## 2019-12-15 MED ORDER — METHYLERGONOVINE MALEATE 0.2 MG/ML IJ SOLN
0.2000 mg | Freq: Once | INTRAMUSCULAR | Status: AC
  Administered 2019-12-15: 0.2 mg via INTRAMUSCULAR
  Filled 2019-12-15: qty 1

## 2019-12-15 NOTE — ED Notes (Signed)
Recent miscarriage   Here for continued lower abd pain

## 2019-12-15 NOTE — ED Triage Notes (Signed)
Pt c/o continued lower abdominal pain since Friday. Pt reports she had a miscarriage last week and the pain got better the next day and then last night the pain came right back. N/V Friday morning. Denies fever.

## 2019-12-15 NOTE — ED Notes (Signed)
Call to [facility] for med 

## 2019-12-15 NOTE — ED Provider Notes (Signed)
[facility] Provider Note   CSN: [id number] Arrival date & time: 12/15/19  1705     History Chief Complaint  Patient presents with  . Abdominal Pain    recent miscarriage    Caroline Good is a 24 y.o. female.  Patient is a 24 year old female who recently miscarried first trimester during her first pregnancy.  She presents today with ongoing suprapubic cramping and bleeding.  [facility]  She denies any fevers or chills.  She denies any lightheadedness or fainting spells.  The history is provided by the patient.  Abdominal Pain Pain location:  Suprapubic Pain quality: cramping   Pain radiates to:  Does not radiate Pain severity:  Moderate Onset quality:  Gradual Duration:  3 days Timing:  Constant Progression:  Worsening      Past Medical History:  Diagnosis Date  . Anxiety   . Miscarriage     There are no problems to display for this patient.   Past Surgical History:  Procedure Laterality Date  . TONSILLECTOMY       OB History    Gravida  1   Para  0   Term  0   Preterm  0   AB  1   Living  0     SAB  0   TAB  0   Ectopic  0   Multiple  0   Live Births  0           Family History  Problem Relation Age of Onset  . Heart disease Paternal Grandfather   . Diabetes Paternal Grandfather   . Heart attack Maternal Grandfather   . Miscarriages / Stillbirths Father   . Miscarriages / [place] Mother     Social History   Tobacco Use  . Smoking status: Never Smoker  . Smokeless tobacco: Never Used  Substance Use Topics  . Alcohol use: No  . Drug use: No    Home Medications Prior to Admission medications   Medication Sig Start Date End Date Taking? Authorizing Provider  acetaminophen (TYLENOL) 500 MG tablet Take 500 mg by mouth every 6 (six) hours as needed for mild pain.   Yes [provider]  progesterone (PROMETRIUM) 200 MG capsule Place 1 in vagina at  bedtime Patient not taking: Reported on 12/15/2019 11/20/19   Estill Dooms, NP    Allergies    Patient has no known allergies.  Review of Systems   Review of Systems  Gastrointestinal: Positive for abdominal pain.  All other systems reviewed and are negative.   Physical Exam Updated Vital Signs BP (!) 107/59   Pulse 77   Temp 98.8 F (37.1 C) (Oral)   Resp 17   Ht 5\' 4"  (1.626 m)   Wt 53.1 kg   LMP 09/28/2019 (Exact Date) Comment: pt had miscarriage 2 days ago on 12/13/19  SpO2 100%   Breastfeeding No   BMI 20.08 kg/m   Physical Exam Vitals and nursing note reviewed.  Constitutional:      General: She is not in acute distress.    Appearance: She is well-developed. She is not diaphoretic.  HENT:     Head: Normocephalic and atraumatic.  Cardiovascular:     Rate and Rhythm: Normal rate and regular rhythm.     Heart sounds: No murmur. No friction rub. No gallop.   Pulmonary:     Effort: Pulmonary effort is normal. No respiratory distress.  Breath sounds: Normal breath sounds. No wheezing.  Abdominal:     General: Bowel sounds are normal. There is no distension.     Palpations: Abdomen is soft.     Tenderness: There is abdominal tenderness in the suprapubic area. There is no guarding or rebound.  Genitourinary:    Vagina: Bleeding present.     Comments: There is slight bleeding from the cervical os.  There is no active hemorrhage and exam is otherwise unremarkable. Musculoskeletal:        General: Normal range of motion.     Cervical back: Normal range of motion and neck supple.  Skin:    General: Skin is warm and dry.  Neurological:     Mental Status: She is alert and oriented to person, place, and time.     ED Results / Procedures / Treatments   Labs (all labs ordered are listed, but only abnormal results are displayed) Labs Reviewed  CBC WITH DIFFERENTIAL/PLATELET - Abnormal; Notable for the following components:      Result Value   RBC 2.85 (*)     Hemoglobin 9.3 (*)    HCT 27.6 (*)    All other components within normal limits  HCG, QUANTITATIVE, PREGNANCY - Abnormal; Notable for the following components:   hCG, Beta Chain, Quant, S 4,141 (*)    All other components within normal limits  HEMOGLOBIN AND HEMATOCRIT, BLOOD - Abnormal; Notable for the following components:   Hemoglobin 9.1 (*)    HCT 27.2 (*)    All other components within normal limits  BASIC METABOLIC PANEL  ABO/RH    EKG None  Radiology No results found.  Procedures Procedures (including critical care time)  Medications Ordered in ED Medications  HYDROcodone-acetaminophen (NORCO/VICODIN) 5-325 MG per tablet 1 tablet (1 tablet Oral Given 12/15/19 1955)  methylergonovine (METHERGINE) injection 0.2 mg (0.2 mg Intramuscular Given 12/15/19 2111)    ED Course  I have reviewed the triage vital signs and the nursing notes.  Pertinent labs & imaging results that were available during my care of the patient were reviewed by me and considered in my medical decision making (see chart for details).    MDM Rules/Calculators/A&P  Patient with ongoing cramping and bleeding since a miscarriage several days ago.  Patient is hemodynamically stable and initial hemoglobin is 9.3 with repeat of 9.1, 3 hours later.  Care was discussed with Dr. Jerrol Banana from Ob/GYN.  She is recommending IM Methergine and follow-up with her OB/GYN tomorrow.  Patient does have an appointment scheduled for 4:00.  I have advised her to call in the morning to see if this appointment can be moved up.  If she worsens, I have advised her to go to [facility] hospital for evaluation.  Final Clinical Impression(s) / ED Diagnoses Final diagnoses:  None    Rx / DC Orders ED Discharge Orders    None       Geoffery Lyons, MD 12/15/19 2202

## 2019-12-15 NOTE — Discharge Instructions (Addendum)
Take hydrocodone as prescribed as needed for pain.  You are to follow-up with your OB/GYN tomorrow morning.  If your bleeding worsens in the meantime, [facility] [place] [facility] be evaluated.

## 2019-12-16 ENCOUNTER — Encounter: Payer: Self-pay | Admitting: Adult Health

## 2019-12-16 ENCOUNTER — Other Ambulatory Visit: Payer: Self-pay

## 2019-12-16 ENCOUNTER — Ambulatory Visit (INDEPENDENT_AMBULATORY_CARE_PROVIDER_SITE_OTHER): Payer: 59 | Admitting: Adult Health

## 2019-12-16 VITALS — BP 107/65 | HR 92 | Ht 64.0 in | Wt 114.8 lb

## 2019-12-16 DIAGNOSIS — O039 Complete or unspecified spontaneous abortion without complication: Secondary | ICD-10-CM | POA: Insufficient documentation

## 2019-12-16 NOTE — Progress Notes (Addendum)
  Subjective:     Patient ID: Caroline Good, female   DOB: 03-06-1996, 24 y.o.   MRN: [id number]  HPI Caroline Good is a 24 year old white female, back in follow up of miscarriage,Was seen in ER [facility] at [facility] had dropped to 4141, blood type is O+, and HGB was 9.1.She was given methergine and bleeding has slowed, she said she passed tissue Thursday. PCP is Dr Phillips Odor.   Review of Systems No pain Bleeding has slowed A little dizzy if stands for long time  Reviewed past medical,surgical, social and family history. Reviewed medications and allergies.     Objective:   Physical Exam BP 107/65 (BP Location: Left Arm, Patient Position: Sitting, Cuff Size: Normal)   Pulse 92   Ht 5\' 4"  (1.626 m)   Wt 114 lb 12.8 oz (52.1 kg)   BMI 19.71 kg/m  Skin warm and dry.  Lungs: clear to ausculation bilaterally. Cardiovascular: regular rate and rhythm.Abdomen is soft and non tender.    Assessment:     1. Miscarriage    Plan:     Check QHCG in 1 week,orders given Increase fluids Take PNV Use condoms if has sex Follow up in 2 weeks

## 2019-12-24 ENCOUNTER — Inpatient Hospital Stay (EMERGENCY_DEPARTMENT_HOSPITAL)
Admission: AD | Admit: 2019-12-24 | Discharge: 2019-12-24 | Disposition: A | Discharge status: Home / Self Care | Payer: 59 | Source: Home / Self Care | Attending: Family Medicine | Admitting: Family Medicine

## 2019-12-24 ENCOUNTER — Encounter (HOSPITAL_COMMUNITY): Payer: Self-pay | Admitting: Family Medicine

## 2019-12-24 ENCOUNTER — Other Ambulatory Visit: Payer: Self-pay

## 2019-12-24 ENCOUNTER — Inpatient Hospital Stay (HOSPITAL_COMMUNITY): Payer: 59

## 2019-12-24 DIAGNOSIS — D649 Anemia, unspecified: Secondary | ICD-10-CM | POA: Insufficient documentation

## 2019-12-24 DIAGNOSIS — O034 Incomplete spontaneous abortion without complication: Secondary | ICD-10-CM | POA: Insufficient documentation

## 2019-12-24 DIAGNOSIS — R109 Unspecified abdominal pain: Secondary | ICD-10-CM | POA: Insufficient documentation

## 2019-12-24 DIAGNOSIS — Z8759 Personal history of other complications of pregnancy, childbirth and the puerperium: Secondary | ICD-10-CM | POA: Insufficient documentation

## 2019-12-24 DIAGNOSIS — O26891 Other specified pregnancy related conditions, first trimester: Secondary | ICD-10-CM | POA: Insufficient documentation

## 2019-12-24 DIAGNOSIS — O99011 Anemia complicating pregnancy, first trimester: Secondary | ICD-10-CM | POA: Insufficient documentation

## 2019-12-24 DIAGNOSIS — Z3A12 12 weeks gestation of pregnancy: Secondary | ICD-10-CM

## 2019-12-24 DIAGNOSIS — O0337 Sepsis following incomplete spontaneous abortion: Secondary | ICD-10-CM | POA: Diagnosis not present

## 2019-12-24 DIAGNOSIS — O0387 Sepsis following complete or unspecified spontaneous abortion: Secondary | ICD-10-CM | POA: Diagnosis not present

## 2019-12-24 LAB — CBC
HCT: 26.1 % — ABNORMAL LOW (ref 36.0–46.0)
Hemoglobin: 8.9 g/dL — ABNORMAL LOW (ref 12.0–15.0)
MCH: 31.9 pg (ref 26.0–34.0)
MCHC: 34.1 g/dL (ref 30.0–36.0)
MCV: 93.5 fL (ref 80.0–100.0)
Platelets: 263 10*3/uL (ref 150–400)
RBC: 2.79 MIL/uL — ABNORMAL LOW (ref 3.87–5.11)
RDW: 11.6 % (ref 11.5–15.5)
WBC: 7.9 10*3/uL (ref 4.0–10.5)
nRBC: 0 % (ref 0.0–0.2)

## 2019-12-24 LAB — HCG, QUANTITATIVE, PREGNANCY: hCG, Beta Chain, Quant, S: 852 m[IU]/mL — ABNORMAL HIGH (ref <5)

## 2019-12-24 MED ORDER — FERROUS SULFATE 325 (65 FE) MG PO TABS: 325.0000 mg | ORAL_TABLET | Freq: Two times a day (BID) | ORAL | 0 refills | Status: DC

## 2019-12-24 MED ORDER — MISOPROSTOL 200 MCG PO TABS
800.0000 ug | ORAL_TABLET | Freq: Once | ORAL | Status: AC
  Administered 2019-12-24: 800 ug via BUCCAL
  Filled 2019-12-24: qty 4

## 2019-12-24 MED ORDER — IBUPROFEN 800 MG PO TABS
800.0000 mg | ORAL_TABLET | Freq: Once | ORAL | Status: AC
  Administered 2019-12-24: 19:00:00 800 mg via ORAL
  Filled 2019-12-24: qty 1

## 2019-12-24 NOTE — MAU Note (Signed)
Is bleeding a lot, started initially on the 12th, had slowed down a lot started back yesterday and is heavier today. Dx with a miscarriage on 2/8. Is having a lot of pain,cramping.

## 2019-12-24 NOTE — Discharge Instructions (Signed)
Anemia  Anemia is a condition in which you do not have enough red blood cells or hemoglobin. Hemoglobin is a substance in red blood cells that carries oxygen. When you do not have enough red blood cells or hemoglobin (are anemic), your body cannot get enough oxygen and your organs may not work properly. As a result, you may feel very tired or have other problems. What are the causes? Common causes of anemia include:  Excessive bleeding. Anemia can be caused by excessive bleeding inside or outside the body, including bleeding from the intestine or from periods in women.  Poor nutrition.  Long-lasting (chronic) kidney, thyroid, and liver disease.  Bone marrow disorders.  Cancer and treatments for cancer.  HIV (human immunodeficiency virus) and AIDS (acquired immunodeficiency syndrome).  Treatments for HIV and AIDS.  Spleen problems.  Blood disorders.  Infections, medicines, and autoimmune disorders that destroy red blood cells. What are the signs or symptoms? Symptoms of this condition include:  Minor weakness.  Dizziness.  Headache.  Feeling heartbeats that are irregular or faster than normal (palpitations).  Shortness of breath, especially with exercise.  Paleness.  Cold sensitivity.  Indigestion.  Nausea.  Difficulty sleeping.  Difficulty concentrating. Symptoms may occur suddenly or develop slowly. If your anemia is mild, you may not have symptoms. How is this diagnosed? This condition is diagnosed based on:  Blood tests.  Your medical history.  A physical exam.  Bone marrow biopsy. Your health care provider may also check your stool (feces) for blood and may do additional testing to look for the cause of your bleeding. You may also have other tests, including:  Imaging tests, such as a CT scan or MRI.  Endoscopy.  Colonoscopy. How is this treated? Treatment for this condition depends on the cause. If you continue to lose a lot of blood, you may  need to be treated at a hospital. Treatment may include:  Taking supplements of iron, vitamin M08, or folic acid.  Taking a hormone medicine (erythropoietin) that can help to stimulate red blood cell growth.  Having a blood transfusion. This may be needed if you lose a lot of blood.  Making changes to your diet.  Having surgery to remove your spleen. Follow these instructions at home:  Take over-the-counter and prescription medicines only as told by your health care provider.  Take supplements only as told by your health care provider.  Follow any diet instructions that you were given.  Keep all follow-up visits as told by your health care provider. This is important. Contact a health care provider if:  You develop new bleeding anywhere in the body. Get help right away if:  You are very weak.  You are short of breath.  You have pain in your abdomen or chest.  You are dizzy or feel faint.  You have trouble concentrating.  You have bloody or black, tarry stools.  You vomit repeatedly or you vomit up blood. Summary  Anemia is a condition in which you do not have enough red blood cells or enough of a substance in your red blood cells that carries oxygen (hemoglobin).  Symptoms may occur suddenly or develop slowly.  If your anemia is mild, you may not have symptoms.  This condition is diagnosed with blood tests as well as a medical history and physical exam. Other tests may be needed.  Treatment for this condition depends on the cause of the anemia. This information is not intended to replace advice given to you by  your health care provider. Make sure you discuss any questions you have with your health care provider. Document Revised: 09/29/2017 Document Reviewed: 11/18/2016 Elsevier Patient Education  2020 [employer].   Incomplete Miscarriage A miscarriage is the loss of an unborn baby (fetus) before the 20th week of pregnancy. In an incomplete miscarriage, parts  of the fetus or placenta (afterbirth) remain in the body. Most miscarriages happen in the first 3 months of pregnancy. Sometimes, it happens before a woman even knows she is pregnant. Having a miscarriage can be an emotional experience. If you have had a miscarriage, talk with your health care provider about any questions you may have about miscarrying, the grieving process, and your future pregnancy plans. What are the causes? This condition may be caused by:  Problems with the genes or chromosomes that make it impossible for the baby to develop normally. These problems are most often the result of random errors that occur early in development, and are not passed from parent to child (not inherited).  Infection of the cervix or uterus.  Conditions that affect hormone balance in the body.  Problems with the cervix, such as the cervix opening and thinning before pregnancy is at term (cervical insufficiency).  Problems with the uterus, such as a uterus with an abnormal shape, fibroids in the uterus, or problems that were present from birth (congenital abnormalities).  Certain medical conditions.  Smoking, drinking alcohol, or using drugs.  Injury (trauma). Many times, the cause of a miscarriage is not known. What are the signs or symptoms? Symptoms of this condition include:  Vaginal bleeding or spotting, with or without cramps or pain.  Pain or cramping in the abdomen or lower back.  Passing fluid, tissue, or blood clots from the vagina. How is this diagnosed? This condition may be diagnosed based on:  A physical exam.  Ultrasound.  Blood tests.  Urine tests. How is this treated? An incomplete miscarriage may be treated with:  Dilation and curettage (D&C). This is a procedure in which the cervix is stretched open and the lining of the uterus (endometrium) is scraped to remove any remaining tissue from the pregnancy.  Medicines, such as: ? Antibiotic medicine to treat  infection. ? Medicine to help any remaining tissue pass out of your uterus. ? Medicine to reduce (contract) the size of the uterus. These medicines may be given if you have a lot of bleeding. If you have Rh negative blood and your baby was Rh positive, you will need a shot of medicine called Rh immunoglobulinto protect future babies from Rh blood problems. "Rh-negative" and "Rh-positive" refer to whether or not the blood has a specific protein found on the surface of red blood cells (Rh factor). Follow these instructions at home: Medicines   Take over-the-counter and prescription medicines only as told by your health care provider.  If you were prescribed antibiotic medicine, take your antibiotic as told by your health care provider. Do not stop taking the antibiotic even if you start to feel better.  Do not take NSAIDs, such as aspirin and ibuprofen, unless approved by your doctor. These medicines can cause bleeding. Activity  Rest as directed. Ask your health care provider what activities are safe for you.  Have someone help with home and family responsibilities during this time. General instructions  Keep track of the number of sanitary pads you use each day and how soaked (saturated) they are. Write down this information.  Monitor the amount of tissue or blood clots  that you pass from your vagina. Save any large amounts of tissue for your health care provider to examine.  Do not use tampons, douche, or have sex until your health care provider approves.  To help you and your partner with the process of grieving, talk with your health care provider or seek counseling to help cope with the pregnancy loss.  When you are ready, meet with your health care provider to discuss important steps you should take for your health, as well as steps to take in order to have a healthy pregnancy in the future.  Keep all follow-up visits as told by your health care provider. This is important. Where  to find more information  The American Congress of Obstetricians and Gynecologists: www.acog.org  U.S. Department of Health and Programmer, systems of Womens Health: [place] Contact a health care provider if:  You have a fever or chills.  You have a foul smelling vaginal discharge. Get help right away if:  You have severe cramps or pain in your back or abdomen.  You pass walnut-sized (or larger) blood clots or tissue from your vagina.  You have heavy bleeding, soaking more than 1 regular sanitary pad in an hour.  You become lightheaded or weak.  You pass out.  You have feelings of sadness that take over your thoughts, or you have thoughts of hurting yourself. Summary  In an incomplete miscarriage, parts of the fetus or placenta (afterbirth) remain in the body.  There are multiple treatment options for an incomplete miscarriage, talk to your health care provider about the best option for you.  Follow your health care provider's instructions for follow-up care.  To help you and your partner with the process of grieving, talk with your health care provider or seek counseling to help cope with the pregnancy loss. This information is not intended to replace advice given to you by your health care provider. Make sure you discuss any questions you have with your health care provider. Document Revised: 11/23/2017 Document Reviewed: 11/23/2016 Elsevier Patient Education  2020 [employer].

## 2019-12-24 NOTE — MAU Provider Note (Addendum)
History     CSN: [id number]  Arrival date and time: 12/24/19 1756   First Provider Initiated Contact with Patient 12/24/19 1836      Chief Complaint  Patient presents with  . Vaginal Bleeding  . Abdominal Pain   Caroline Good is a 24 y.o. G1P0000 at [redacted]w[redacted]d who receives care at [facility].  She presents today for Vaginal Bleeding and Abdominal Pain.  She states she was seen on Feb 8th and told she was having a miscarriage.  She reports she started having bleeding on that following Friday that eventually slowed down.  She reports she is currently having "pretty heavy" bleeding and reports using 4 pads in the past hour.  She states she passing clots about the size of a strawberry.  She reports cramping that is intermittent and rates a 4/10.  She reports she has not taken anything for her pain.  She reports getting a "shot of something to stop the bleeding" in the ER.     OB History    Gravida  1   Para  0   Term  0   Preterm  0   AB  0   Living  0     SAB  0   TAB  0   Ectopic  0   Multiple  0   Live Births  0           Past Medical History:  Diagnosis Date  . Anxiety   . Miscarriage     Past Surgical History:  Procedure Laterality Date  . TONSILLECTOMY      Family History  Problem Relation Age of Onset  . Heart disease Paternal Grandfather   . Diabetes Paternal Grandfather   . Heart attack Maternal Grandfather   . Miscarriages / Stillbirths Father   . Miscarriages / [place] Mother     Social History   Tobacco Use  . Smoking status: Never Smoker  . Smokeless tobacco: Never Used  Substance Use Topics  . Alcohol use: No  . Drug use: No    Allergies: No Known Allergies  Medications Prior to Admission  Medication Sig Dispense Refill Last Dose  . acetaminophen (TYLENOL) 500 MG tablet Take 500 mg by mouth every 6 (six) hours as needed for mild pain.   Past Week at Unknown time  . HYDROcodone-acetaminophen (NORCO) 5-325 MG tablet Take 1-2  tablets by mouth every 6 (six) hours as needed. 12 tablet 0 Past Week at Unknown time    Review of Systems  Constitutional: Negative for chills and fever.  Respiratory: Negative for cough and shortness of breath.   Gastrointestinal: Positive for abdominal pain. Negative for constipation, diarrhea, nausea and vomiting.  Genitourinary: Positive for vaginal bleeding. Negative for difficulty urinating, dysuria, pelvic pain and vaginal discharge.  Musculoskeletal: Positive for back pain (Lower-bilateral).  Neurological: Negative for dizziness, light-headedness and headaches.   Physical Exam   Blood pressure 108/60, pulse (!) 114, temperature 99.2 F (37.3 C), temperature source Oral, resp. rate 16, height 5\' 4"  (1.626 m), weight 51.3 kg, last menstrual period 09/28/2019, SpO2 100 %.  Physical Exam  Constitutional: She is oriented to person, place, and time. She appears well-developed and well-nourished. No distress.  HENT:  Head: Normocephalic and atraumatic.  Eyes: Conjunctivae are normal.  Cardiovascular: Normal rate, regular rhythm and normal heart sounds.  Respiratory: Effort normal and breath sounds normal.  GI: Soft. Bowel sounds are normal. There is no abdominal tenderness.  Genitourinary:    Vaginal  bleeding present.  There is bleeding in the vagina.    Genitourinary Comments: Speculum Exam: -Normal External Genitalia: Non tender,  Blood and small clot noted at introitus. -Vaginal Vault: Moderate amt blood and clots removed with ring forceps and faux swabs x 3 -Cervix:Pink, no lesions, cysts, or polyps.  Appears open. Scant bleeding from os -Bimanual Exam:  Deferred    Musculoskeletal:        General: Normal range of motion.     Cervical back: Normal range of motion.  Neurological: She is alert and oriented to person, place, and time.  Skin: Skin is warm and dry. There is pallor.  Psychiatric: She has a normal mood and affect. Her behavior is normal.    MAU Course   Procedures Results for orders placed or performed during the hospital encounter of 12/24/19 (from the past 24 hour(s))  CBC     Status: Abnormal   Collection Time: 12/24/19  6:41 PM  Result Value Ref Range   WBC 7.9 4.0 - 10.5 K/uL   RBC 2.79 (L) 3.87 - 5.11 MIL/uL   Hemoglobin 8.9 (L) 12.0 - 15.0 g/dL   HCT 65.7 (L) 84.6 - 96.2 %   MCV 93.5 80.0 - 100.0 fL   MCH 31.9 26.0 - 34.0 pg   MCHC 34.1 30.0 - 36.0 g/dL   RDW 95.2 84.1 - 32.4 %   Platelets 263 150 - 400 K/uL   nRBC 0.0 0.0 - 0.2 %  hCG, quantitative, pregnancy     Status: Abnormal   Collection Time: 12/24/19  6:41 PM  Result Value Ref Range   hCG, Beta Chain, Quant, S 852 (H) <5 mIU/mL   US OB Transvaginal  Result Date: 12/24/2019 CLINICAL DATA:  24 year old pregnant female with pelvic cramping and vaginal bleeding. LMP: 09/28/2019 corresponding to an estimated gestational age of [redacted] weeks, 3 days. EXAM: TRANSVAGINAL OB ULTRASOUND TECHNIQUE: Transvaginal ultrasound was performed for complete evaluation of the gestation as well as the maternal uterus, adnexal regions, and pelvic cul-de-sac. COMPARISON:  Pelvic ultrasound dated 12/09/2019. FINDINGS: There is a 2.5 x 0.6 x 1.5 cm complex tissue with solid and cystic component in the endocervical region which may represent blood clot or correspond to the cystic/sac-like structure seen on the prior ultrasound. The overall appearance most concerning for miscarriage in progress. Retained products of conception is not excluded. Correlation with clinical exam and serial HCG levels and follow-up with ultrasound as clinically indicated. The ovaries are unremarkable. Small free fluid within the pelvis. IMPRESSION: Findings most consistent with miscarriage in progress. Retained product of conception is not entirely excluded. Clinical correlation and follow-up recommended. Electronically Signed   By: Elgie Collard M.D.   On: 12/24/2019 19:53    MDM Pelvic Exam Labs: hCG, CBC Pain  Medication  Assessment and Plan  24 year old SAB-Expectant  -POC reviewed. -Exam performed and findings discussed. -Labs ordered. -Patient offered and accepts pain medication. -Will give ibuprofen 800mg  now -Send for ultrasound  12/24/2019, 6:36 PM   Reassessment (7:28 PM) -hCG returns significantly reduced -Patient in 12/26/2019 -Report given to E.[place], NP who assumes care.   Lyman Bishop MSN, CNM Advanced Practice Provider, [facility]    Patient reports resolution of pain after ibuprofen & bleeding has appeared to slow down.  Orthostatic per HR but asymptomatic.  Reviewed patient with Dr. [facility]. Ultrasound looks like possible RPOC, hemoglobin 8.9, pt's pale, and orthostatic. Will give cytotec in MAU & obs for at  least 2 hours. IV access to be obtained prior to administration & patient will remain NPO with exception of some ice chips.   Care turned over to [place], NP 12/24/2019 8:42 PM   Assumed care Patient reports not cramping much and bleeding is small   We observed her for 2 more hours Her bleeding and cramping did not increase much Vitals:   12/24/19 1811 12/24/19 2007 12/24/19 2312  BP: 108/60 (!) 98/49 (!) 103/44  Pulse: (!) 114 72 73  Resp: 16 18   Temp: 99.2 F (37.3 C) 98.7 F (37.1 C)   TempSrc: Oral    SpO2: 100%    Weight: 51.3 kg    Height: 5\' 4"  (1.626 m)       Discharged her home I reviewed expected bleeding pattern as well as how much bleeding to come back for Pt will touch base with office in the morning re: progress and bleeding I messaged office to get her in for post SAB exam and counseling. Encouraged to return here or to other Urgent Care/ED if she develops worsening of symptoms, increase in pain, fever, or other concerning symptoms.    Seabron Spates, CNM

## 2019-12-26 ENCOUNTER — Other Ambulatory Visit: Payer: Self-pay

## 2019-12-26 ENCOUNTER — Inpatient Hospital Stay (HOSPITAL_COMMUNITY)
Admission: EM | Admit: 2019-12-26 | Discharge: 2019-12-28 | DRG: 770 | Disposition: A | Discharge status: Home / Self Care | Payer: 59 | Attending: Obstetrics and Gynecology | Admitting: Obstetrics and Gynecology

## 2019-12-26 DIAGNOSIS — Z79891 Long term (current) use of opiate analgesic: Secondary | ICD-10-CM

## 2019-12-26 DIAGNOSIS — O0387 Sepsis following complete or unspecified spontaneous abortion: Secondary | ICD-10-CM | POA: Diagnosis present

## 2019-12-26 DIAGNOSIS — A419 Sepsis, unspecified organism: Secondary | ICD-10-CM

## 2019-12-26 DIAGNOSIS — Z20822 Contact with and (suspected) exposure to covid-19: Secondary | ICD-10-CM | POA: Diagnosis present

## 2019-12-26 DIAGNOSIS — Z79899 Other long term (current) drug therapy: Secondary | ICD-10-CM

## 2019-12-26 DIAGNOSIS — O0337 Sepsis following incomplete spontaneous abortion: Principal | ICD-10-CM | POA: Diagnosis present

## 2019-12-26 DIAGNOSIS — D62 Acute posthemorrhagic anemia: Secondary | ICD-10-CM | POA: Diagnosis present

## 2019-12-26 DIAGNOSIS — O0330 Unspecified complication following incomplete spontaneous abortion: Secondary | ICD-10-CM

## 2019-12-26 DIAGNOSIS — O034 Incomplete spontaneous abortion without complication: Secondary | ICD-10-CM

## 2019-12-26 MED ORDER — ACETAMINOPHEN 500 MG PO TABS
1000.0000 mg | ORAL_TABLET | Freq: Once | ORAL | Status: AC
  Administered 2019-12-26: 1000 mg via ORAL
  Filled 2019-12-26: qty 2

## 2019-12-26 MED ORDER — LACTATED RINGERS IV BOLUS (SEPSIS)
250.0000 mL | Freq: Once | INTRAVENOUS | Status: AC
  Administered 2019-12-27: 250 mL via INTRAVENOUS

## 2019-12-26 MED ORDER — CLINDAMYCIN PHOSPHATE 900 MG/50ML IV SOLN
900.0000 mg | Freq: Once | INTRAVENOUS | Status: AC
  Administered 2019-12-27: 900 mg via INTRAVENOUS
  Filled 2019-12-26: qty 50

## 2019-12-26 MED ORDER — LACTATED RINGERS IV BOLUS (SEPSIS)
1000.0000 mL | Freq: Once | INTRAVENOUS | Status: AC
  Administered 2019-12-27: 1000 mL via INTRAVENOUS

## 2019-12-26 MED ORDER — LACTATED RINGERS IV BOLUS (SEPSIS)
500.0000 mL | Freq: Once | INTRAVENOUS | Status: AC
  Administered 2019-12-27: 500 mL via INTRAVENOUS

## 2019-12-26 NOTE — ED Triage Notes (Signed)
Pt recently miscarried two weeks ago. Went to [facility]  8. Started bleeding the 12. York Spaniel today that she could tell she was hot and her heart rate was up. Then vomited 1 time and now has an increase in bleeding.

## 2019-12-27 ENCOUNTER — Inpatient Hospital Stay (HOSPITAL_COMMUNITY): Payer: 59

## 2019-12-27 ENCOUNTER — Other Ambulatory Visit: Payer: Self-pay

## 2019-12-27 DIAGNOSIS — O0387 Sepsis following complete or unspecified spontaneous abortion: Secondary | ICD-10-CM

## 2019-12-27 DIAGNOSIS — O0330 Unspecified complication following incomplete spontaneous abortion: Secondary | ICD-10-CM

## 2019-12-27 DIAGNOSIS — Z79891 Long term (current) use of opiate analgesic: Secondary | ICD-10-CM | POA: Diagnosis not present

## 2019-12-27 DIAGNOSIS — Z20822 Contact with and (suspected) exposure to covid-19: Secondary | ICD-10-CM | POA: Diagnosis present

## 2019-12-27 DIAGNOSIS — D62 Acute posthemorrhagic anemia: Secondary | ICD-10-CM | POA: Diagnosis present

## 2019-12-27 DIAGNOSIS — Z79899 Other long term (current) drug therapy: Secondary | ICD-10-CM | POA: Diagnosis not present

## 2019-12-27 DIAGNOSIS — O0337 Sepsis following incomplete spontaneous abortion: Secondary | ICD-10-CM | POA: Diagnosis present

## 2019-12-27 DIAGNOSIS — A419 Sepsis, unspecified organism: Secondary | ICD-10-CM | POA: Diagnosis present

## 2019-12-27 HISTORY — DX: Sepsis following complete or unspecified spontaneous abortion: O03.87

## 2019-12-27 LAB — CBC WITH DIFFERENTIAL/PLATELET
Abs Immature Granulocytes: 0.05 10*3/uL (ref 0.00–0.07)
Abs Immature Granulocytes: 0.06 10*3/uL (ref 0.00–0.07)
Abs Immature Granulocytes: 0.07 10*3/uL (ref 0.00–0.07)
Basophils Absolute: 0 10*3/uL (ref 0.0–0.1)
Basophils Absolute: 0 10*3/uL (ref 0.0–0.1)
Basophils Absolute: 0 10*3/uL (ref 0.0–0.1)
Basophils Relative: 0 %
Basophils Relative: 0 %
Basophils Relative: 0 %
Eosinophils Absolute: 0 10*3/uL (ref 0.0–0.5)
Eosinophils Absolute: 0 10*3/uL (ref 0.0–0.5)
Eosinophils Absolute: 0 10*3/uL (ref 0.0–0.5)
Eosinophils Relative: 0 %
Eosinophils Relative: 0 %
Eosinophils Relative: 0 %
HCT: 18.9 % — ABNORMAL LOW (ref 36.0–46.0)
HCT: 19 % — ABNORMAL LOW (ref 36.0–46.0)
HCT: 22.6 % — ABNORMAL LOW (ref 36.0–46.0)
Hemoglobin: 6.3 g/dL — CL (ref 12.0–15.0)
Hemoglobin: 6.5 g/dL — CL (ref 12.0–15.0)
Hemoglobin: 7.5 g/dL — ABNORMAL LOW (ref 12.0–15.0)
Immature Granulocytes: 0 %
Immature Granulocytes: 1 %
Immature Granulocytes: 1 %
Lymphocytes Relative: 3 %
Lymphocytes Relative: 5 %
Lymphocytes Relative: 5 %
Lymphs Abs: 0.4 10*3/uL — ABNORMAL LOW (ref 0.7–4.0)
Lymphs Abs: 0.6 10*3/uL — ABNORMAL LOW (ref 0.7–4.0)
Lymphs Abs: 0.6 10*3/uL — ABNORMAL LOW (ref 0.7–4.0)
MCH: 31.4 pg (ref 26.0–34.0)
MCH: 31.8 pg (ref 26.0–34.0)
MCH: 32 pg (ref 26.0–34.0)
MCHC: 33.2 g/dL (ref 30.0–36.0)
MCHC: 33.3 g/dL (ref 30.0–36.0)
MCHC: 34.2 g/dL (ref 30.0–36.0)
MCV: 93.6 fL (ref 80.0–100.0)
MCV: 94.6 fL (ref 80.0–100.0)
MCV: 95.5 fL (ref 80.0–100.0)
Monocytes Absolute: 0.6 10*3/uL (ref 0.1–1.0)
Monocytes Absolute: 0.6 10*3/uL (ref 0.1–1.0)
Monocytes Absolute: 0.9 10*3/uL (ref 0.1–1.0)
Monocytes Relative: 5 %
Monocytes Relative: 5 %
Monocytes Relative: 8 %
Neutro Abs: 10.4 10*3/uL — ABNORMAL HIGH (ref 1.7–7.7)
Neutro Abs: 10.4 10*3/uL — ABNORMAL HIGH (ref 1.7–7.7)
Neutro Abs: 11 10*3/uL — ABNORMAL HIGH (ref 1.7–7.7)
Neutrophils Relative %: 86 %
Neutrophils Relative %: 90 %
Neutrophils Relative %: 91 %
Platelets: 162 10*3/uL (ref 150–400)
Platelets: 173 10*3/uL (ref 150–400)
Platelets: 220 10*3/uL (ref 150–400)
RBC: 1.98 MIL/uL — ABNORMAL LOW (ref 3.87–5.11)
RBC: 2.03 MIL/uL — ABNORMAL LOW (ref 3.87–5.11)
RBC: 2.39 MIL/uL — ABNORMAL LOW (ref 3.87–5.11)
RDW: 11.7 % (ref 11.5–15.5)
RDW: 11.8 % (ref 11.5–15.5)
RDW: 12 % (ref 11.5–15.5)
WBC: 11.7 10*3/uL — ABNORMAL HIGH (ref 4.0–10.5)
WBC: 11.9 10*3/uL — ABNORMAL HIGH (ref 4.0–10.5)
WBC: 12.1 10*3/uL — ABNORMAL HIGH (ref 4.0–10.5)
nRBC: 0 % (ref 0.0–0.2)
nRBC: 0 % (ref 0.0–0.2)
nRBC: 0 % (ref 0.0–0.2)

## 2019-12-27 LAB — LACTIC ACID, PLASMA
Lactic Acid, Venous: 1.8 mmol/L (ref 0.5–1.9)
Lactic Acid, Venous: 1.1 mmol/L (ref 0.5–1.9)

## 2019-12-27 LAB — COMPREHENSIVE METABOLIC PANEL
ALT: 17 U/L (ref 0–44)
AST: 18 U/L (ref 15–41)
Albumin: 3.7 g/dL (ref 3.5–5.0)
Alkaline Phosphatase: 57 U/L (ref 38–126)
Anion gap: 8 (ref 5–15)
BUN: 10 mg/dL (ref 6–20)
CO2: 21 mmol/L — ABNORMAL LOW (ref 22–32)
Calcium: 8.3 mg/dL — ABNORMAL LOW (ref 8.9–10.3)
Chloride: 107 mmol/L (ref 98–111)
Creatinine, Ser: 0.61 mg/dL (ref 0.44–1.00)
GFR calc Af Amer: >60 mL/min (ref >60)
GFR calc non Af Amer: >60 mL/min (ref >60)
Glucose, Bld: 127 mg/dL — ABNORMAL HIGH (ref 70–99)
Potassium: 3.2 mmol/L — ABNORMAL LOW (ref 3.5–5.1)
Sodium: 136 mmol/L (ref 135–145)
Total Bilirubin: 0.4 mg/dL (ref 0.3–1.2)
Total Protein: 6.4 g/dL — ABNORMAL LOW (ref 6.5–8.1)

## 2019-12-27 LAB — RESPIRATORY PANEL BY RT PCR (FLU A&B, COVID)
Influenza A by PCR: NEGATIVE
Influenza B by PCR: NEGATIVE
SARS Coronavirus 2 by RT PCR: NEGATIVE

## 2019-12-27 LAB — PREPARE RBC (CROSSMATCH)

## 2019-12-27 LAB — PROTIME-INR
INR: 1.1 (ref 0.8–1.2)
Prothrombin Time: 14.5 seconds (ref 11.4–15.2)

## 2019-12-27 LAB — SAMPLE TO BLOOD BANK

## 2019-12-27 LAB — HCG, QUANTITATIVE, PREGNANCY: hCG, Beta Chain, Quant, S: 502 m[IU]/mL — ABNORMAL HIGH (ref <5)

## 2019-12-27 LAB — SURGICAL PATHOLOGY

## 2019-12-27 LAB — APTT: aPTT: 31 seconds (ref 24–36)

## 2019-12-27 MED ORDER — PRENATAL MULTIVITAMIN CH
1.0000 | ORAL_TABLET | Freq: Every day | ORAL | Status: DC
  Administered 2019-12-28: 12:00:00 1 via ORAL
  Filled 2019-12-27 (×4): qty 1

## 2019-12-27 MED ORDER — SODIUM CHLORIDE 0.9% IV SOLUTION: Freq: Once | INTRAVENOUS | Status: DC

## 2019-12-27 MED ORDER — ACETAMINOPHEN 325 MG PO TABS
650.0000 mg | ORAL_TABLET | Freq: Once | ORAL | Status: AC
  Administered 2019-12-27: 09:00:00 650 mg via ORAL
  Filled 2019-12-27: qty 2

## 2019-12-27 MED ORDER — SODIUM CHLORIDE 0.9 % IV SOLN: INTRAVENOUS | Status: DC

## 2019-12-27 MED ORDER — KETOROLAC TROMETHAMINE 30 MG/ML IJ SOLN
15.0000 mg | Freq: Once | INTRAMUSCULAR | Status: AC
  Administered 2019-12-27: 15 mg via INTRAVENOUS
  Filled 2019-12-27: qty 1

## 2019-12-27 MED ORDER — LACTATED RINGERS IV BOLUS
1000.0000 mL | Freq: Once | INTRAVENOUS | Status: AC
  Administered 2019-12-27: 1000 mL via INTRAVENOUS

## 2019-12-27 MED ORDER — LEVOFLOXACIN IN D5W 500 MG/100ML IV SOLN
500.0000 mg | INTRAVENOUS | Status: AC
  Administered 2019-12-27 – 2019-12-28 (×2): 500 mg via INTRAVENOUS
  Filled 2019-12-27 (×2): qty 100

## 2019-12-27 MED ORDER — HYDROCODONE-ACETAMINOPHEN 5-325 MG PO TABS
1.0000 | ORAL_TABLET | ORAL | Status: DC | PRN
  Administered 2019-12-27: 1 via ORAL
  Filled 2019-12-27 (×2): qty 1

## 2019-12-27 MED ORDER — METRONIDAZOLE IN NACL 5-0.79 MG/ML-% IV SOLN
500.0000 mg | Freq: Four times a day (QID) | INTRAVENOUS | Status: DC
  Administered 2019-12-27 – 2019-12-28 (×6): 500 mg via INTRAVENOUS
  Filled 2019-12-27 (×6): qty 100

## 2019-12-27 MED ORDER — ACETAMINOPHEN 325 MG PO TABS
650.0000 mg | ORAL_TABLET | Freq: Four times a day (QID) | ORAL | Status: DC | PRN
  Administered 2019-12-27: 650 mg via ORAL
  Filled 2019-12-27: qty 2

## 2019-12-27 MED ORDER — POTASSIUM CHLORIDE CRYS ER 20 MEQ PO TBCR
40.0000 meq | EXTENDED_RELEASE_TABLET | Freq: Once | ORAL | Status: AC
  Administered 2019-12-27: 05:00:00 40 meq via ORAL
  Filled 2019-12-27: qty 2

## 2019-12-27 MED ORDER — DEXTROSE IN LACTATED RINGERS 5 % IV SOLN: INTRAVENOUS | Status: DC

## 2019-12-27 MED ORDER — SODIUM CHLORIDE 0.9 % IV SOLN
1.0000 g | Freq: Once | INTRAVENOUS | Status: AC
  Administered 2019-12-27: 1 g via INTRAVENOUS
  Filled 2019-12-27: qty 10

## 2019-12-27 NOTE — Progress Notes (Signed)
Subjective:  Patient reports feeling fatigue, chills. Able to ambulate without dizziness. Minimal bleeding.  Objective: I have reviewed patient's vital signs.labs.  BP 104/71   Pulse (!) 103   Temp 99.5 F (37.5 C) (Oral)   Resp 20   Ht 5\' 4"  (1.626 m)   Wt 51.3 kg   LMP 09/28/2019 (Exact Date) Comment: pt had miscarriage 2 days ago on 12/13/19  SpO2 100%   BMI 19.43 kg/m    CBC Latest Ref Rng & Units 12/27/2019 12/27/2019 12/27/2019  WBC 4.0 - 10.5 K/uL 11.7(H) 11.9(H) 12.1(H)  Hemoglobin 12.0 - 15.0 g/dL 6.3(LL) 6.5(LL) 7.5(L)  Hematocrit 36.0 - 46.0 % 18.9(L) 19.0(L) 22.6(L)  Platelets 150 - 400 K/uL 162 173 220   This morning she is willing to accept blood. With persistent chills, will admit for IV antibiotics , transfusion 2 units prbc and reassessment  General: alert, cooperative and appears stated age Resp: resp 20 unlabored GI: abdomen soft, no guarding Vaginal Bleeding: minimal   Assessment/Plan: S/p incomplete Ab, completed Acute blood loss anemia R/o septic abortion  Admit for Transfusion 2 units, IV levaquin and Flagyl.  LOS: 0 days    12/29/2019 12/27/2019, 8:01 AM

## 2019-12-27 NOTE — ED Notes (Signed)
Date and time results received: 12/27/19 2:49 AM (use smartphrase ".now" to insert current time)  Test: hgb Critical Value: 6.5  Name of Provider Notified: Sherryl Manges MD  Orders Received? Or Actions Taken?:

## 2019-12-27 NOTE — Consult Note (Signed)
Reason for Consult: INcomplete spontaneous Ab             Referring Physician: Lynelle Doctor MD  Caroline Good is an 24 y.o. female. Who has been seen recently in MAU for incomplete Ab, was given Cytotec 800 mcg po, but has continued to bleed without passage of tissue.  She presents tonight with continued bleeding . Low pressures and anemia ,  CBC    Component Value Date/Time   WBC 12.1 (H) 12/27/2019 0012   RBC 2.39 (L) 12/27/2019 0012   HGB 7.5 (L) 12/27/2019 0012   HCT 22.6 (L) 12/27/2019 0012   PLT 220 12/27/2019 0012   MCV 94.6 12/27/2019 0012   MCH 31.4 12/27/2019 0012   MCHC 33.2 12/27/2019 0012   RDW 11.7 12/27/2019 0012   LYMPHSABS 0.4 (L) 12/27/2019 0012   MONOABS 0.6 12/27/2019 0012   EOSABS 0.0 12/27/2019 0012   BASOSABS 0.0 12/27/2019 0012   And Lactic acid 1.8, given rocephin .  Review of u/s shows POC's were in lower uterine segment.  Pertinent Gynecological History: Menses: pregnant Bleeding: moderate continued bleeding Contraception: none DES exposure: unknown Blood transfusions: none Sexually transmitted diseases: no past history Previous GYN Procedures:   Last mammogram:  Date:  Last pap:  Date: none on record, age 36 likely hasn't had one OB History: G1, P0 now P1   Menstrual History: Menarche age:  Patient's last menstrual period was 09/28/2019 (exact date).    Past Medical History:  Diagnosis Date  . Anxiety   . Miscarriage     Past Surgical History:  Procedure Laterality Date  . TONSILLECTOMY      Family History  Problem Relation Age of Onset  . Heart disease Paternal Grandfather   . Diabetes Paternal Grandfather   . Heart attack Maternal Grandfather   . Miscarriages / Stillbirths Father   . Miscarriages / [place] Mother     Social History:  reports that she has never smoked. She has never used smokeless tobacco. She reports that she does not drink alcohol or use drugs.  Allergies: No Known Allergies   Medications: I have  reviewed the patient's current medications.  Review of Systems  Blood pressure (!) 91/43, pulse (!) 110, temperature (!) 101.3 F (38.5 C), resp. rate 18, height 5\' 4"  (1.626 m), weight 51.3 kg, last menstrual period 09/28/2019, SpO2 97 %. Physical Exam  Constitutional: She is oriented to person, place, and time. She appears well-developed and well-nourished.  HENT:  Head: Normocephalic.  Eyes: Pupils are equal, round, and reactive to light. EOM are normal.  Cardiovascular: Normal rate.  Tachycardia, BP (!) 91/43   Pulse (!) 110   Temp (!) 101.3 F (38.5 C)   Resp 18   Ht 5\' 4"  (1.626 m)   Wt 51.3 kg   LMP 09/28/2019 (Exact Date) Comment: pt had miscarriage 2 days ago on 12/13/19  SpO2 97%   BMI 19.43 kg/m    Respiratory: Effort normal.  GI: Soft.  Genitourinary:    Vagina normal.     Genitourinary Comments: Speculum exam shows fresh blood at os, nonpurulent. No tissue visible. Able to place ring forceps thru dilated cervix, grasp the POC's and with pt valsalva able to extract a 4 cm long solid POC. Upper portion somewhat necrotic upper portion, likely the source of fever and possible sepsis. Further probing only obtains fresh blood , no additional tissue.    Musculoskeletal:        General: Normal range of motion.  Cervical back: Normal range of motion.  Neurological: She is alert and oriented to person, place, and time.  Skin: Skin is warm and dry. There is pallor.  Psychiatric: She has a normal mood and affect. Her behavior is normal. Judgment and thought content normal.    Results for orders placed or performed during the hospital encounter of 12/26/19 (from the past 48 hour(s))  Comprehensive metabolic panel     Status: Abnormal   Collection Time: 12/27/19 12:12 AM  Result Value Ref Range   Sodium 136 135 - 145 mmol/L   Potassium 3.2 (L) 3.5 - 5.1 mmol/L   Chloride 107 98 - 111 mmol/L   CO2 21 (L) 22 - 32 mmol/L   Glucose, Bld 127 (H) 70 - 99 mg/dL    Comment:  Glucose reference range applies only to samples taken after fasting for at least 8 hours.   BUN 10 6 - 20 mg/dL   Creatinine, Ser 0.96 0.44 - 1.00 mg/dL   Calcium 8.3 (L) 8.9 - 10.3 mg/dL   Total Protein 6.4 (L) 6.5 - 8.1 g/dL   Albumin 3.7 3.5 - 5.0 g/dL   AST 18 15 - 41 U/L   ALT 17 0 - 44 U/L   Alkaline Phosphatase 57 38 - 126 U/L   Total Bilirubin 0.4 0.3 - 1.2 mg/dL   GFR calc non Af Amer >60 >60 mL/min   GFR calc Af Amer >60 >60 mL/min   Anion gap 8 5 - 15    Comment: Performed at [facility], [address] [place], [place] [address]  CBC with Differential     Status: Abnormal   Collection Time: 12/27/19 12:12 AM  Result Value Ref Range   WBC 12.1 (H) 4.0 - 10.5 K/uL   RBC 2.39 (L) 3.87 - 5.11 MIL/uL   Hemoglobin 7.5 (L) 12.0 - 15.0 g/dL   HCT 29.4 (L) 76.5 - 46.5 %   MCV 94.6 80.0 - 100.0 fL   MCH 31.4 26.0 - 34.0 pg   MCHC 33.2 30.0 - 36.0 g/dL   RDW 03.5 46.5 - 68.1 %   Platelets 220 150 - 400 K/uL   nRBC 0.0 0.0 - 0.2 %   Neutrophils Relative % 91 %   Neutro Abs 11.0 (H) 1.7 - 7.7 K/uL   Lymphocytes Relative 3 %   Lymphs Abs 0.4 (L) 0.7 - 4.0 K/uL   Monocytes Relative 5 %   Monocytes Absolute 0.6 0.1 - 1.0 K/uL   Eosinophils Relative 0 %   Eosinophils Absolute 0.0 0.0 - 0.5 K/uL   Basophils Relative 0 %   Basophils Absolute 0.0 0.0 - 0.1 K/uL   Immature Granulocytes 1 %   Abs Immature Granulocytes 0.07 0.00 - 0.07 K/uL    Comment: Performed at [facility], [address], [place], [place] [address]  Protime-INR     Status: None   Collection Time: 12/27/19 12:12 AM  Result Value Ref Range   Prothrombin Time 14.5 11.4 - 15.2 seconds   INR 1.1 0.8 - 1.2    Comment: (NOTE) INR goal varies based on device and disease states. Performed at [facility], [address], [place], [place] [address]   APTT     Status: None   Collection Time: 12/27/19 12:12 AM  Result Value Ref Range   aPTT 31 24 - 36 seconds    Comment: Performed at [facility], [address] [place], [place] [address]  Lactic acid, plasma  Status: None   Collection Time: 12/27/19 12:14 AM  Result Value Ref Range   Lactic Acid, Venous 1.8 0.5 - 1.9 mmol/L    Comment: Performed at [facility], [address], [place], [place] [address]  hCG, quantitative, pregnancy     Status: Abnormal   Collection Time: 12/27/19 12:14 AM  Result Value Ref Range   hCG, Beta Chain, Quant, S 502 (H) <5 mIU/mL    Comment:          GEST. AGE      CONC.  (mIU/mL)   <=1 WEEK        5 - 50     2 WEEKS       50 - 500     3 WEEKS       100 - 10,000     4 WEEKS     1,000 - 30,000     5 WEEKS     3,500 - 115,000   6-8 WEEKS     12,000 - 270,000    12 WEEKS     15,000 - 220,000        FEMALE AND NON-PREGNANT FEMALE:     LESS THAN 5 mIU/mL Performed at [facility], [address] [place], [place] [address]   Sample to Blood Bank     Status: None   Collection Time: 12/27/19 12:16 AM  Result Value Ref Range   Blood Bank Specimen SAMPLE AVAILABLE FOR TESTING    Sample Expiration      12/30/2019,2359 Performed at [facility], [address] [place], [place] [address]   Respiratory Panel by RT PCR (Flu A&B, Covid) - Nasopharyngeal Swab     Status: None   Collection Time: 12/27/19 12:20 AM   Specimen: Nasopharyngeal Swab  Result Value Ref Range   SARS Coronavirus 2 by RT PCR NEGATIVE NEGATIVE    Comment: (NOTE) SARS-CoV-2 target nucleic acids are NOT DETECTED. The SARS-CoV-2 RNA is generally detectable in upper respiratoy specimens during the acute phase of infection. The lowest concentration of SARS-CoV-2 viral copies this assay can detect is 131 copies/mL. A negative result does not preclude SARS-Cov-2 infection and should not be used as the sole basis for treatment or other patient management decisions. A negative result may occur with  improper specimen collection/handling, submission of specimen other than nasopharyngeal swab, presence of viral mutation(s) within the areas targeted by  this assay, and inadequate number of viral copies (<131 copies/mL). A negative result must be combined with clinical observations, patient history, and epidemiological information. The expected result is Negative. Fact Sheet for Patients:  https://www.moore.com/ Fact Sheet for Healthcare Providers:  https://www.young.biz/ This test is not yet ap proved or cleared by the [place] FDA and  has been authorized for detection and/or diagnosis of SARS-CoV-2 by FDA under an Emergency Use Authorization (EUA). This EUA will remain  in effect (meaning this test can be used) for the duration of the COVID-19 declaration under Section 564(b)(1) of the Act, 21 U.S.C. section 360bbb-3(b)(1), unless the authorization is terminated or revoked sooner.    Influenza A by PCR NEGATIVE NEGATIVE   Influenza B by PCR NEGATIVE NEGATIVE    Comment: (NOTE) The Xpert Xpress SARS-CoV-2/FLU/RSV assay is intended as an aid in  the diagnosis of influenza from Nasopharyngeal swab specimens and  should not be used as a sole basis for treatment. Nasal washings and  aspirates are unacceptable for Xpert Xpress SARS-CoV-2/FLU/RSV  testing. Fact Sheet for Patients: https://www.moore.com/ Fact Sheet for  Healthcare Providers: GravelBags.it This test is not yet approved or cleared by the [place] and  has been authorized for detection and/or diagnosis of SARS-CoV-2 by  FDA under an Emergency Use Authorization (EUA). This EUA will remain  in effect (meaning this test can be used) for the duration of the  Covid-19 declaration under Section 564(b)(1) of the Act, 21  U.S.C. section 360bbb-3(b)(1), unless the authorization is  terminated or revoked. Performed at [facility], [address], [place], [place] [address]    CBC Latest Ref Rng & Units 12/27/2019 12/27/2019 12/24/2019  WBC 4.0 - 10.5 K/uL 11.9(H) 12.1(H) 7.9   Hemoglobin 12.0 - 15.0 g/dL 6.5(LL) 7.5(L) 8.9(L)  Hematocrit 36.0 - 46.0 % 19.0(L) 22.6(L) 26.1(L)  Platelets 150 - 400 K/uL 173 220 263     No results found.  Assessment/Plan: INcomplete Abortion now completed. R/o sepsis, should improve quickly now that poc's removed  Plan : overnight obs inED            Second lactate pending           Pt declines transfusion at this time, will reassess/ rediscuss in a.m              Jonnie Kind 12/27/2019

## 2019-12-27 NOTE — Progress Notes (Signed)
Subjective: Patient reports tolerating PO and no problems voiding.  Feels better already Recent VS T 102.7.  Objective: I have reviewed patient's vital signs and intake and output. Has received first unit prbc.  General: alert, cooperative, appears stated age and no distress BP (!) 114/55 (BP Location: Right Arm)   Pulse 94   Temp (!) 102.7 F (39.3 C) (Oral) Comment: Nurse Notified  Resp 18   Ht 5\' 4"  (1.626 m)   Wt 51.3 kg   LMP 09/28/2019 (Exact Date) Comment: pt had miscarriage 2 days ago on 12/13/19  SpO2 100%   BMI 19.43 kg/m    Assessment/Plan: incomplete abortion now extracted in ED.   Fever unrelated to blood products. Will continue transfusion. Will continue IV levaquin and flagyl. If fever persists, consider formal D&C.   LOS: 0 days    02/10/20 12/27/2019, 3:19 PM

## 2019-12-27 NOTE — ED Provider Notes (Signed)
[facility] EMERGENCY DEPARTMENT Provider Note   CSN: [id number] Arrival date & time: 12/26/19  2338   Time seen 11:54 PM  History Chief Complaint  Patient presents with  . Fever    Caroline Good is a 24 y.o. female.  HPI   Patient states she is G1, P0 Ab0, she states she started having bleeding around [place]. She states she was seen there at a local ED. She was seen in the ED on February 14 for continued bleeding and had a exam that showed bleeding from the cervical os with abdominal tenderness.  At that time her beta hCG was 4141.  Her hemoglobin was 9.1.  She was seen at [facility] hospital on February 23, and had an ultrasound which showed a cystic/saclike structure in the endocervical region felt to be a blood clot products of conception was not excluded.  They felt she was having a miscarriage in process..  She has had continued bleeding and lower abdominal pain.  She states she started having chills about 6 PM tonight and then about an hour ago started having nausea and vomiting and fever.  PCP Sharilyn Sites, MD [facility]  Past Medical History:  Diagnosis Date  . Anxiety   . Miscarriage     Patient Active Problem List   Diagnosis Date Noted  . Miscarriage 12/16/2019    Past Surgical History:  Procedure Laterality Date  . TONSILLECTOMY       OB History    Gravida  1   Para  0   Term  0   Preterm  0   AB  0   Living  0     SAB  0   TAB  0   Ectopic  0   Multiple  0   Live Births  0           Family History  Problem Relation Age of Onset  . Heart disease Paternal Grandfather   . Diabetes Paternal Grandfather   . Heart attack Maternal Grandfather   . Miscarriages / Stillbirths Father   . Miscarriages / [place] Mother     Social History   Tobacco Use  . Smoking status: Never Smoker  . Smokeless tobacco: Never Used  Substance Use Topics  . Alcohol use: No  . Drug use: No    Home Medications Prior  to Admission medications   Medication Sig Start Date End Date Taking? Authorizing Provider  acetaminophen (TYLENOL) 500 MG tablet Take 500 mg by mouth every 6 (six) hours as needed for mild pain.    [provider]  ferrous sulfate 325 (65 FE) MG tablet Take 1 tablet (325 mg total) by mouth 2 (two) times daily with a meal. 12/24/19   Seabron Spates, CNM  HYDROcodone-acetaminophen (NORCO) 5-325 MG tablet Take 1-2 tablets by mouth every 6 (six) hours as needed. 12/15/19   Veryl Speak, MD    Allergies    Patient has no known allergies.  Review of Systems   Review of Systems  All other systems reviewed and are negative.   Physical Exam Updated Vital Signs ED Triage Vitals  Enc Vitals Group     BP 12/26/19 2348 105/63     Pulse Rate 12/26/19 2348 (!) 125     Resp 12/26/19 2348 (!) 22     Temp 12/26/19 2348 (!) 103.1 F (39.5 C)     Temp src --      SpO2 12/26/19  2348 100 %     Weight 12/26/19 2347 113 lb 3.2 oz (51.3 kg)     Height 12/26/19 2347 5\' 4"  (1.626 m)     Head Circumference --      Peak Flow --      Pain Score 12/26/19 2347 7     Pain Loc --      Pain Edu? --      Excl. in GC? --    Vital signs normal except for tachycardia, fever, borderline blood pressure   Physical Exam Vitals and nursing note reviewed.  Constitutional:      General: She is in acute distress.     Appearance: Normal appearance. She is ill-appearing.  HENT:     Head: Normocephalic and atraumatic.     Right Ear: External ear normal.     Left Ear: External ear normal.     Nose: Nose normal.     Mouth/Throat:     Mouth: Mucous membranes are dry.  Eyes:     Extraocular Movements: Extraocular movements intact.     Conjunctiva/sclera: Conjunctivae normal.     Pupils: Pupils are equal, round, and reactive to light.  Cardiovascular:     Rate and Rhythm: Regular rhythm. Tachycardia present.  Pulmonary:     Effort: Pulmonary effort is normal. No respiratory distress.  Abdominal:      General: Abdomen is flat.     Tenderness: There is abdominal tenderness. There is guarding. There is no rebound.    Musculoskeletal:        General: Normal range of motion.     Cervical back: Normal range of motion.  Skin:    General: Skin is warm and dry.     Coloration: Skin is pale.  Neurological:     General: No focal deficit present.     Mental Status: She is alert and oriented to person, place, and time.     Cranial Nerves: No cranial nerve deficit.  Psychiatric:        Mood and Affect: Affect is flat.        Speech: Speech is delayed.        Behavior: Behavior is slowed.     ED Results / Procedures / Treatments   Labs (all labs ordered are listed, but only abnormal results are displayed) Results for orders placed or performed during the hospital encounter of 12/26/19  Respiratory Panel by RT PCR (Flu A&B, Covid) - Nasopharyngeal Swab   Specimen: Nasopharyngeal Swab  Result Value Ref Range   SARS Coronavirus 2 by RT PCR NEGATIVE NEGATIVE   Influenza A by PCR NEGATIVE NEGATIVE   Influenza B by PCR NEGATIVE NEGATIVE  Comprehensive metabolic panel  Result Value Ref Range   Sodium 136 135 - 145 mmol/L   Potassium 3.2 (L) 3.5 - 5.1 mmol/L   Chloride 107 98 - 111 mmol/L   CO2 21 (L) 22 - 32 mmol/L   Glucose, Bld 127 (H) 70 - 99 mg/dL   BUN 10 6 - 20 mg/dL   Creatinine, Ser 12/28/19 0.44 - 1.00 mg/dL   Calcium 8.3 (L) 8.9 - 10.3 mg/dL   Total Protein 6.4 (L) 6.5 - 8.1 g/dL   Albumin 3.7 3.5 - 5.0 g/dL   AST 18 15 - 41 U/L   ALT 17 0 - 44 U/L   Alkaline Phosphatase 57 38 - 126 U/L   Total Bilirubin 0.4 0.3 - 1.2 mg/dL   GFR calc non Af Amer >60 >60 mL/min  GFR calc Af Amer >60 >60 mL/min   Anion gap 8 5 - 15  Lactic acid, plasma  Result Value Ref Range   Lactic Acid, Venous 1.8 0.5 - 1.9 mmol/L  Lactic acid, plasma  Result Value Ref Range   Lactic Acid, Venous 1.1 0.5 - 1.9 mmol/L  CBC with Differential  Result Value Ref Range   WBC 12.1 (H) 4.0 - 10.5 K/uL   RBC  2.39 (L) 3.87 - 5.11 MIL/uL   Hemoglobin 7.5 (L) 12.0 - 15.0 g/dL   HCT 25.9 (L) 56.3 - 87.5 %   MCV 94.6 80.0 - 100.0 fL   MCH 31.4 26.0 - 34.0 pg   MCHC 33.2 30.0 - 36.0 g/dL   RDW 64.3 32.9 - 51.8 %   Platelets 220 150 - 400 K/uL   nRBC 0.0 0.0 - 0.2 %   Neutrophils Relative % 91 %   Neutro Abs 11.0 (H) 1.7 - 7.7 K/uL   Lymphocytes Relative 3 %   Lymphs Abs 0.4 (L) 0.7 - 4.0 K/uL   Monocytes Relative 5 %   Monocytes Absolute 0.6 0.1 - 1.0 K/uL   Eosinophils Relative 0 %   Eosinophils Absolute 0.0 0.0 - 0.5 K/uL   Basophils Relative 0 %   Basophils Absolute 0.0 0.0 - 0.1 K/uL   Immature Granulocytes 1 %   Abs Immature Granulocytes 0.07 0.00 - 0.07 K/uL  Protime-INR  Result Value Ref Range   Prothrombin Time 14.5 11.4 - 15.2 seconds   INR 1.1 0.8 - 1.2  APTT  Result Value Ref Range   aPTT 31 24 - 36 seconds  hCG, quantitative, pregnancy  Result Value Ref Range   hCG, Beta Chain, Quant, S 502 (H) <5 mIU/mL  CBC with Differential  Result Value Ref Range   WBC 11.9 (H) 4.0 - 10.5 K/uL   RBC 2.03 (L) 3.87 - 5.11 MIL/uL   Hemoglobin 6.5 (LL) 12.0 - 15.0 g/dL   HCT 84.1 (L) 66.0 - 63.0 %   MCV 93.6 80.0 - 100.0 fL   MCH 32.0 26.0 - 34.0 pg   MCHC 34.2 30.0 - 36.0 g/dL   RDW 16.0 10.9 - 32.3 %   Platelets 173 150 - 400 K/uL   nRBC 0.0 0.0 - 0.2 %   Neutrophils Relative % 86 %   Neutro Abs 10.4 (H) 1.7 - 7.7 K/uL   Lymphocytes Relative 5 %   Lymphs Abs 0.6 (L) 0.7 - 4.0 K/uL   Monocytes Relative 8 %   Monocytes Absolute 0.9 0.1 - 1.0 K/uL   Eosinophils Relative 0 %   Eosinophils Absolute 0.0 0.0 - 0.5 K/uL   Basophils Relative 0 %   Basophils Absolute 0.0 0.0 - 0.1 K/uL   Immature Granulocytes 1 %   Abs Immature Granulocytes 0.06 0.00 - 0.07 K/uL  Sample to Blood Bank  Result Value Ref Range   Blood Bank Specimen SAMPLE AVAILABLE FOR TESTING    Sample Expiration      12/30/2019,2359 Performed at [facility], [address], [place], [place] [address]     Laboratory interpretation all normal except decreasing beta hCG, worsening anemia, her hemoglobin on February 14 was 9.3, and on February 23 was 8.9   EKG EKG Interpretation  Date/Time:  Friday December 27 2019 00:06:07 EST Ventricular Rate:  115 PR Interval:    QRS Duration: 95 QT Interval:  287 QTC Calculation: 397 R Axis:   90 Text Interpretation: Sinus or ectopic atrial tachycardia Consider  RVH w/ secondary repol abnormality Nonspecific T abnormalities, lateral leads Baseline wander in lead(s) V2 Since last tracing rate faster 17 Dec 2018 Confirmed by Devoria Albe ([id number]) on 12/27/2019 12:14:43 AM   Radiology No results found.   US OB Comp Less 14 Wks  Result Date: 12/11/2019 FOLLOW UP SONOGRAM Consuelo L Cobb is in the office for a follow up sonogram for viability. She is a 24 y.o. year old G1P0000 with Estimated Date of Delivery: 07/03/20 by LMP now at  [redacted]w[redacted]d weeks gestation. Thus far the pregnancy has been complicated by no fetal pole or YS visualized on prior ultrasound,spotting. FINDINGS: Intrauterine gestational sac: Single nonviable intrauterine pregnancy.                                 Yolk sac no      :  Visible Embryo: no    Visible Cardiac Activity: n/a Heart Rate: n/a                              Subchorionic hemorrhage:  several areas visualized.                              Maternal uterus/adnexae: Ovaries are  within normal limits.                                                                       Right ovary seen                                                                       Left ovary    seen                                                                       Free Fluid  n/a    Single  non viable intrauterine pregnancy as above.                                      Expectant management anticipated. Pt to have options reveiewed by JAG. Tilda Burrow 12/11/2019 Tilda Burrow, MD   US OB Transvaginal  Result Date: 12/24/2019 CLINICAL DATA:  24 year old  pregnant female with pelvic cramping and vaginal bleeding. LMP: 09/28/2019 corresponding to an estimated gestational age of [redacted] weeks, 3 days. . IMPRESSION: Findings most consistent with miscarriage in progress. Retained product of conception is not entirely excluded. Clinical correlation and follow-up recommended. Electronically Signed   By: Elgie Collard M.D.   On: 12/24/2019  19:53   US OB Transvaginal  Result Date: 12/11/2019 FOLLOW UP SONOGRAM Ranisha L Cobb is in the office for a follow up sonogram for viability. She is a 24 y.o. year old G1P0000 with Estimated Date of Delivery: 07/03/20 by LMP now at  6080w3d weeks gestation. Thus far the pregnancy has been complicated by no fetal pole or YS visualized on prior ultrasound,spotting.FINDINGS: Intrauterine gestational sac: Single nonviable intrauterine pregnancy.                                 Yolk sac no      :  Visible Embryo: no    Visible Cardiac Activity: n/a Heart Rate: n/a                              Subchorionic hemorrhage:  several areas visualized.                              Maternal uterus/adnexae: Ovaries are  within normal limits.                                                                       Right ovary seen                                                                       Left ovary    seen                                                                       Free Fluid  n/a    Single  non viable intrauterine pregnancy as above.                                      Expectant management anticipated. Pt to have options reveiewed by JAG. Tilda BurrowJohn V Ferguson 12/11/2019 Tilda BurrowJohn V Ferguson, MD      Procedures Procedures (including critical care time)  Medications Ordered in ED Medications  dextrose 5 % in lactated ringers infusion (has no administration in time range)  acetaminophen (TYLENOL) tablet 1,000 mg (1,000 mg Oral Given 12/26/19 2355)  lactated ringers bolus 1,000 mL (0 mLs Intravenous Stopped 12/27/19 0112)    And   lactated ringers bolus 500 mL (0 mLs Intravenous Stopped 12/27/19 0112)    And  lactated ringers bolus 250 mL (0 mLs Intravenous Stopped 12/27/19 0112)  clindamycin (CLEOCIN) IVPB 900 mg (0 mg Intravenous Stopped 12/27/19 0112)  cefTRIAXone (ROCEPHIN) 1 g in sodium chloride 0.9 % 100  mL IVPB (0 g Intravenous Stopped 12/27/19 0253)  lactated ringers bolus 1,000 mL (0 mLs Intravenous Stopped 12/27/19 0254)  ketorolac (TORADOL) 30 MG/ML injection 15 mg (15 mg Intravenous Given 12/27/19 0215)  lactated ringers bolus 1,000 mL (1,000 mLs Intravenous New Bag/Given (Non-Interop) 12/27/19 0310)    ED Course  I have reviewed the triage vital signs and the nursing notes.  Pertinent labs & imaging results that were available during my care of the patient were reviewed by me and considered in my medical decision making (see chart for details).    MDM Rules/Calculators/A&P                      Patient was given IV fluids based on sepsis protocol.  Laboratory testing was done, she was started on IV antibiotics.  On February 14 type and screen was done and patient was O+  12:23 AM patient discussed with Dr. Emelda Fear, OB/GYN on-call.  We discussed her antibiotics, he would like to give her Rocephin 1 g IV, that was ordered.  He will come see patient.  12:55 AM patient's blood pressure is 94/45, heart rate 122.  She has almost finished her 30 cc/kg bolus of lactated Ringer's.  1/3 L of LR was ordered.  1:25 AM blood pressure is 86/43 with heart rate 111.  2 AM Dr. Emelda Fear has seen patient.  2:10 AM he has asked me to continue giving her IV fluids of D5 LR at 200 cc/h.  He will recheck the patient in the morning.  4:10 AM patient sleeping, heart rate is now 88, blood pressure 84/42.  5:10 AM blood pressure 99/45, heart rate 80.  7:00 AM patient turned over to Dr. Effie Shy at change of shift. Dr. Emelda Fear is going to recheck the patient this morning and determine her disposition.  Final Clinical  Impression(s) / ED Diagnoses Final diagnoses:  Incomplete abortion  Sepsis without acute organ dysfunction, due to unspecified organism [facility])    Rx / DC Orders   Disposition pending  Devoria Albe, MD, Concha Pyo, MD 12/27/19 0700

## 2019-12-27 NOTE — H&P (Signed)
Reason for Consult: INcomplete spontaneous Ab  Referring Physician: Lynelle Doctor MD  Caroline Good is an 24 y.o. female. Who has been seen recently in [facility]U for incomplete Ab, was given Cytotec 800 mcg po, but has continued to bleed without passage of tissue.  She presents tonight with continued bleeding . Low pressures and anemia ,  CBC  Labs (Brief)                                                                                              And Lactic acid 1.8, given rocephin .  Review of u/s shows POC's were in lower uterine segment.  Pertinent Gynecological History:  Menses: pregnant  Bleeding: moderate continued bleeding  Contraception: none  DES exposure: unknown  Blood transfusions: none  Sexually transmitted diseases: no past history  Previous GYN Procedures:  Last mammogram: Date:  Last pap: Date: none on record, age 51 likely hasn't had one  OB History: G1, P0 now P1  Menstrual History:  Menarche age:  Patient's last menstrual period was 09/28/2019 (exact date).       Past Medical History:  Diagnosis Date  . Anxiety   . Miscarriage         Past Surgical History:  Procedure Laterality Date  . TONSILLECTOMY          Family History  Problem Relation Age of Onset  . Heart disease Paternal Grandfather   . Diabetes Paternal Grandfather   . Heart attack Maternal Grandfather   . Miscarriages / Stillbirths Father   . Miscarriages / [place] Mother    Social History: reports that she has never smoked. She has never used smokeless tobacco. She reports that she does not drink alcohol or use drugs.  Allergies: No Known Allergies  Medications: I have reviewed the patient's current medications.  Review of Systems  Blood pressure (!) 91/43, pulse (!) 110, temperature (!) 101.3 F (38.5 C), resp. rate 18, height 5\' 4"  (1.626 m), weight 51.3 kg, last menstrual period 09/28/2019, SpO2 97 %.  Physical Exam  Constitutional: She is oriented to person, place, and  time. She appears well-developed and well-nourished.  HENT:  Head: Normocephalic.  Eyes: Pupils are equal, round, and reactive to light. EOM are normal.  Cardiovascular: Normal rate.  Tachycardia, BP (!) 91/43  Pulse (!) 110  Temp (!) 101.3 F (38.5 C)  Resp 18  Ht 5\' 4"  (1.626 m)  Wt 51.3 kg  LMP 09/28/2019 (Exact Date) Comment: pt had miscarriage 2 days ago on 12/13/19  SpO2 97%  BMI 19.43 kg/m   Respiratory: Effort normal.  GI: Soft.  Genitourinary: Vagina normal.  Genitourinary Comments: Speculum exam shows fresh blood at os, nonpurulent. No tissue visible. Able to place ring forceps thru dilated cervix, grasp the POC's and with pt valsalva able to extract a 4 cm long solid POC. Upper portion somewhat necrotic upper portion, likely the source of fever and possible sepsis. Further probing only obtains fresh blood , no additional tissue.  Musculoskeletal:  General: Normal range of motion.  Cervical back: Normal range of motion.  Neurological: She is alert and oriented to  person, place, and time.  Skin: Skin is warm and dry. There is pallor.  Psychiatric: She has a normal mood and affect. Her behavior is normal. Judgment and thought content normal.   Lab Results Last 48 Hours                                                                                                                                                                                                                                                                                                                                                                                                                                                                                  CBC Latest Ref Rng & Units 12/27/2019 12/27/2019 12/24/2019  WBC 4.0 - 10.5 K/uL 11.9(H) 12.1(H) 7.9  Hemoglobin 12.0 - 15.0 g/dL 6.5(LL) 7.5(L) 8.9(L)  Hematocrit 36.0 -  46.0 % 19.0(L) 22.6(L) 26.1(L)  Platelets 150 - 400 K/uL 173 220 263   Imaging Results (Last 48 hours)    Assessment/Plan:  INcomplete Abortion now completed.  R/o sepsis, should improve quickly now that poc's removed  Plan : overnight obs inED  Second lactate pending  Pt declines transfusion at this time, will reassess/ rediscuss in a.m  Reason  for Consult: INcomplete spontaneous Ab  Referring Physician: Lynelle Doctor MD  Caroline Good is an 24 y.o. female. Who has been seen recently in [facility]U for incomplete Ab, was given Cytotec 800 mcg po, but has continued to bleed without passage of tissue.  She presents tonight with continued bleeding . Low pressures and anemia ,  CBC  Labs (Brief)                                                                                              And Lactic acid 1.8, given rocephin .  Review of u/s shows POC's were in lower uterine segment.  Pertinent Gynecological History:  Menses: pregnant  Bleeding: moderate continued bleeding  Contraception: none  DES exposure: unknown  Blood transfusions: none  Sexually transmitted diseases: no past history  Previous GYN Procedures:  Last mammogram: Date:  Last pap: Date: none on record, age 88 likely hasn't had one  OB History: G1, P0 now P1  Menstrual History:  Menarche age:  Patient's last menstrual period was 09/28/2019 (exact date).       Past Medical History:  Diagnosis Date  . Anxiety   . Miscarriage         Past Surgical History:  Procedure Laterality Date  . TONSILLECTOMY          Family History  Problem Relation Age of Onset  . Heart disease Paternal Grandfather   . Diabetes Paternal Grandfather   . Heart attack Maternal Grandfather   . Miscarriages / Stillbirths Father   . Miscarriages / [place] Mother    Social History: reports that she has never smoked. She has never used smokeless tobacco. She reports that she does not drink alcohol or use drugs.  Allergies: No Known  Allergies  Medications: I have reviewed the patient's current medications.  Review of Systems  Blood pressure (!) 91/43, pulse (!) 110, temperature (!) 101.3 F (38.5 C), resp. rate 18, height 5\' 4"  (1.626 m), weight 51.3 kg, last menstrual period 09/28/2019, SpO2 97 %.  Physical Exam  Constitutional: She is oriented to person, place, and time. She appears well-developed and well-nourished.  HENT:  Head: Normocephalic.  Eyes: Pupils are equal, round, and reactive to light. EOM are normal.  Cardiovascular: Normal rate.  Tachycardia, BP (!) 91/43  Pulse (!) 110  Temp (!) 101.3 F (38.5 C)  Resp 18  Ht 5\' 4"  (1.626 m)  Wt 51.3 kg  LMP 09/28/2019 (Exact Date) Comment: pt had miscarriage 2 days ago on 12/13/19  SpO2 97%  BMI 19.43 kg/m   Respiratory: Effort normal.  GI: Soft.  Genitourinary: Vagina normal.  Genitourinary Comments: Speculum exam shows fresh blood at os, nonpurulent. No tissue visible. Able to place ring forceps thru dilated cervix, grasp the POC's and with pt valsalva able to extract a 4 cm long solid POC. Upper portion somewhat necrotic upper portion, likely the source of fever and possible sepsis. Further probing only obtains fresh blood , no additional tissue.  Musculoskeletal:  General: Normal range of motion.  Cervical back: Normal range of motion.  Neurological: She is alert and oriented to person,  place, and time.  Skin: Skin is warm and dry. There is pallor.  Psychiatric: She has a normal mood and affect. Her behavior is normal. Judgment and thought content normal.   Lab Results Last 48 Hours                                                                                                                                                                                                                                                                                                                                                                                                                                                                                   CBC Latest Ref Rng & Units 12/27/2019 12/27/2019 12/24/2019  WBC 4.0 - 10.5 K/uL 11.9(H) 12.1(H) 7.9  Hemoglobin 12.0 - 15.0 g/dL 6.5(LL) 7.5(L) 8.9(L)  Hematocrit 36.0 - 46.0 % 19.0(L) 22.6(L) 26.1(L)  Platelets 150 - 400 K/uL 173 220 263   Imaging Results (Last 48 hours)    Assessment/Plan:  INcomplete Abortion now completed.  R/o sepsis, should improve quickly now that poc's removed  Plan : overnight obs inED  Second lactate pending  Pt declines transfusion at this time, will reassess/ rediscuss in a.m  Hgb 6.3 , pt with recurrent chills. Will admit for transfusion and iv antibiotics for septic abortion.

## 2019-12-27 NOTE — ED Notes (Signed)
Specimen , "Product of Conception" taken to lab. Preservation fluid added. Specimen signed into log.  With Doylene Bode [facility]

## 2019-12-27 NOTE — ED Provider Notes (Signed)
7:06 AM-patient being treated for missed AB requiring removal of products of conception from the cervical canal.  Ongoing vaginal bleeding for several weeks with significant anemia.  Sepsis markers present, treated with Rocephin and clindamycin.  IV fluid bolus given.  Lactate normal.  Blood pressure soft compared with measurements from prior 2 weeks.  She may require blood transfusion.   Mancel Bale, MD 12/30/19 1459

## 2019-12-27 NOTE — ED Notes (Signed)
Date and time results received: 12/27/19  (use smartphrase ".now" to insert current time)  Test: Hemoglobin   Critical Value: 6.3  Name of Provider Notified:Dr. Emelda Fear  Orders Received? Or Actions Taken?: Orders Received - See Orders for details and Actions Taken: N/A

## 2019-12-28 ENCOUNTER — Inpatient Hospital Stay (HOSPITAL_COMMUNITY): Payer: 59

## 2019-12-28 LAB — CBC WITH DIFFERENTIAL/PLATELET
Abs Immature Granulocytes: 0.03 10*3/uL (ref 0.00–0.07)
Basophils Absolute: 0 10*3/uL (ref 0.0–0.1)
Basophils Relative: 0 %
Eosinophils Absolute: 0.1 10*3/uL (ref 0.0–0.5)
Eosinophils Relative: 2 %
HCT: 30 % — ABNORMAL LOW (ref 36.0–46.0)
Hemoglobin: 9.9 g/dL — ABNORMAL LOW (ref 12.0–15.0)
Immature Granulocytes: 1 %
Lymphocytes Relative: 20 %
Lymphs Abs: 0.9 10*3/uL (ref 0.7–4.0)
MCH: 30.5 pg (ref 26.0–34.0)
MCHC: 33 g/dL (ref 30.0–36.0)
MCV: 92.3 fL (ref 80.0–100.0)
Monocytes Absolute: 0.5 10*3/uL (ref 0.1–1.0)
Monocytes Relative: 11 %
Neutro Abs: 3.2 10*3/uL (ref 1.7–7.7)
Neutrophils Relative %: 66 %
Platelets: 167 10*3/uL (ref 150–400)
RBC: 3.25 MIL/uL — ABNORMAL LOW (ref 3.87–5.11)
RDW: 12.9 % (ref 11.5–15.5)
WBC: 4.8 10*3/uL (ref 4.0–10.5)
nRBC: 0 % (ref 0.0–0.2)

## 2019-12-28 LAB — TYPE AND SCREEN
ABO/RH(D): O POS
Antibody Screen: NEGATIVE
Unit division: 0
Unit division: 0

## 2019-12-28 LAB — BPAM RBC
Blood Product Expiration Date: 202103292359
Blood Product Expiration Date: 202103312359
ISSUE DATE / TIME: 202102261150
ISSUE DATE / TIME: 202102261529
Unit Type and Rh: 5100
Unit Type and Rh: 5100

## 2019-12-28 MED ORDER — HYDROCODONE-ACETAMINOPHEN 5-325 MG PO TABS: 1.0000 | ORAL_TABLET | Freq: Four times a day (QID) | ORAL | 0 refills | Status: AC | PRN

## 2019-12-28 MED ORDER — ACETAMINOPHEN 325 MG PO TABS: 650.0000 mg | ORAL_TABLET | Freq: Four times a day (QID) | ORAL | 99 refills | Status: DC | PRN

## 2019-12-28 MED ORDER — CIPROFLOXACIN HCL 500 MG PO TABS: 500.0000 mg | ORAL_TABLET | Freq: Two times a day (BID) | ORAL | 0 refills | Status: DC

## 2019-12-28 MED ORDER — ONDANSETRON 4 MG PO TBDP: 4.0000 mg | ORAL_TABLET | Freq: Four times a day (QID) | ORAL | 1 refills | Status: DC | PRN

## 2019-12-28 NOTE — Discharge Summary (Signed)
Physician Discharge Summary  Patient ID: Caroline Good MRN: [id number] DOB/AGE: 22-Jul-1996 23 y.o.  Admit date: 12/26/2019 Discharge date: 12/28/2019  Admission Diagnoses: Spontaneous Abortion with septicemia Acute blood loss anemia  Discharge Diagnoses:  Active Problems:   Spontaneous abortion with septicemia Acute blood loss anemia, corrected   Discharged Condition: good  Hospital Course: Ms Caroline Good was admitted via the ED for anemia, fever leukocytosis, and spontneous abortion with suspected septicemia, with CBC showing progressive anemia, mild leukocytosis and fever to as high as 103.  She had POC's removed from the endocervix in the ED,by Jvferguson , with iv antibiotics administered, Levaquin and Flagyl, and transfusion 2 units prbc with good response. CBC Latest Ref Rng & Units 12/28/2019 12/27/2019 12/27/2019  WBC 4.0 - 10.5 K/uL 4.8 11.7(H) 11.9(H)  Hemoglobin 12.0 - 15.0 g/dL 5.2(W) 6.3(LL) 6.5(LL)  Hematocrit 36.0 - 46.0 % 30.0(L) 18.9(L) 19.0(L)  Platelets 150 - 400 K/uL 167 162 173  She felt well enough for outpt care as of 2/27, and was sent home on Flagyl and Cipro.  Pathology showed POC's  Repeat u/s showed a small amount of resitudal tissue and some CDS fluid, but pt was nontender and d/c on 2/27. F/u 2 wk.   Consults: None  Significant Diagnostic Studies: labs:  CBC Latest Ref Rng & Units 12/28/2019 12/27/2019 12/27/2019  WBC 4.0 - 10.5 K/uL 4.8 11.7(H) 11.9(H)  Hemoglobin 12.0 - 15.0 g/dL 4.1(L) 6.3(LL) 6.5(LL)  Hematocrit 36.0 - 46.0 % 30.0(L) 18.9(L) 19.0(L)  Platelets 150 - 400 K/uL 167 162 173     Treatments: IV hydration, antibiotics: Levaquin and metronidazole and manual removal of endocervical tissues.   Discharge Exam: Blood pressure (!) 109/56, pulse 77, temperature 99.8 F (37.7 C), temperature source Oral, resp. rate 18, height 5\' 4"  (1.626 m), weight 51.3 kg, last menstrual period 09/28/2019, SpO2 99 %. General appearance: alert, cooperative,  appears stated age, no distress and mildly obese Head: Normocephalic, without obvious abnormality, atraumatic GI: soft, non-tender; bowel sounds normal; no masses,  no organomegaly Extremities: extremities normal, atraumatic, no cyanosis or edema and Homans sign is negative, no sign of DVT  Disposition:  There are no questions and answers to display.         Allergies as of 12/28/2019   No Known Allergies     Medication List    TAKE these medications   acetaminophen 500 MG tablet Commonly known as: TYLENOL Take 500 mg by mouth every 6 (six) hours as needed for mild pain. What changed: Another medication with the same name was added. Make sure you understand how and when to take each.   acetaminophen 325 MG tablet Commonly known as: TYLENOL Take 2 tablets (650 mg total) by mouth every 6 (six) hours as needed for fever or headache. What changed: You were already taking a medication with the same name, and this prescription was added. Make sure you understand how and when to take each.   ciprofloxacin 500 MG tablet Commonly known as: Cipro Take 1 tablet (500 mg total) by mouth 2 (two) times daily.   HYDROcodone-acetaminophen 5-325 MG tablet Commonly known as: NORCO/VICODIN Take 1-2 tablets by mouth every 6 (six) hours as needed for up to 3 days for moderate pain or severe pain.   tiZANidine 4 MG tablet Commonly known as: ZANAFLEX Take 4 mg by mouth 2 (two) times daily as needed.      Follow-up Information    12/30/2019, MD Follow up in 2 week(s).  Specialties: Obstetrics and Gynecology, Radiology Contact information: [place] [address] [phone]           Signed: Jonnie Kind 12/28/2019, 4:47 PM

## 2019-12-28 NOTE — Discharge Instructions (Signed)
You should stay out of work x 4 days, may return Wednesday, or whenever you feel well after that date.  Dr Emelda Fear may be reached this weekend on his cell for questions at [phone]/  Take your antibiotics for a week to reduce infection risk.  Missed Abortion= miscarriage  Incomplete Miscarriage A miscarriage is the loss of an unborn baby (fetus) before the 20th week of pregnancy. In an incomplete miscarriage, parts of the fetus or placenta (afterbirth) remain in the body. Most miscarriages happen in the first 3 months of pregnancy. Sometimes, it happens before a woman even knows she is pregnant. Having a miscarriage can be an emotional experience. If you have had a miscarriage, talk with your health care provider about any questions you may have about miscarrying, the grieving process, and your future pregnancy plans. What are the causes? This condition may be caused by:  Problems with the genes or chromosomes that make it impossible for the baby to develop normally. These problems are most often the result of random errors that occur early in development, and are not passed from parent to child (not inherited).  Infection of the cervix or uterus.  Conditions that affect hormone balance in the body.  Problems with the cervix, such as the cervix opening and thinning before pregnancy is at term (cervical insufficiency).  Problems with the uterus, such as a uterus with an abnormal shape, fibroids in the uterus, or problems that were present from birth (congenital abnormalities).  Certain medical conditions.  Smoking, drinking alcohol, or using drugs.  Injury (trauma). Many times, the cause of a miscarriage is not known. What are the signs or symptoms? Symptoms of this condition include:  Vaginal bleeding or spotting, with or without cramps or pain.  Pain or cramping in the abdomen or lower back.  Passing fluid, tissue, or blood clots from the vagina. How is this  diagnosed? This condition may be diagnosed based on:  A physical exam.  Ultrasound.  Blood tests.  Urine tests. How is this treated? An incomplete miscarriage may be treated with:  Dilation and curettage (D&C). This is a procedure in which the cervix is stretched open and the lining of the uterus (endometrium) is scraped to remove any remaining tissue from the pregnancy.  Medicines, such as: ? Antibiotic medicine to treat infection. ? Medicine to help any remaining tissue pass out of your uterus. ? Medicine to reduce (contract) the size of the uterus. These medicines may be given if you have a lot of bleeding. If you have Rh negative blood and your baby was Rh positive, you will need a shot of medicine called Rh immunoglobulinto protect future babies from Rh blood problems. "Rh-negative" and "Rh-positive" refer to whether or not the blood has a specific protein found on the surface of red blood cells (Rh factor). Follow these instructions at home: Medicines   Take over-the-counter and prescription medicines only as told by your health care provider.  If you were prescribed antibiotic medicine, take your antibiotic as told by your health care provider. Do not stop taking the antibiotic even if you start to feel better.  Do not take NSAIDs, such as aspirin and ibuprofen, unless approved by your doctor. These medicines can cause bleeding. Activity  Rest as directed. Ask your health care provider what activities are safe for you.  Have someone help with home and family responsibilities during this time. General instructions  Keep track of the number of sanitary pads you use each day and  how soaked (saturated) they are. Write down this information.  Monitor the amount of tissue or blood clots that you pass from your vagina. Save any large amounts of tissue for your health care provider to examine.  Do not use tampons, douche, or have sex until your health care provider  approves.  To help you and your partner with the process of grieving, talk with your health care provider or seek counseling to help cope with the pregnancy loss.  When you are ready, meet with your health care provider to discuss important steps you should take for your health, as well as steps to take in order to have a healthy pregnancy in the future.  Keep all follow-up visits as told by your health care provider. This is important. Where to find more information  The American Congress of Obstetricians and Gynecologists: www.acog.org  U.S. Department of Health and Programmer, systems of Women's Health: [place] Contact a health care provider if:  You have a fever or chills.  You have a foul smelling vaginal discharge. Get help right away if:  You have severe cramps or pain in your back or abdomen.  You pass walnut-sized (or larger) blood clots or tissue from your vagina.  You have heavy bleeding, soaking more than 1 regular sanitary pad in an hour.  You become lightheaded or weak.  You pass out.  You have feelings of sadness that take over your thoughts, or you have thoughts of hurting yourself. Summary  In an incomplete miscarriage, parts of the fetus or placenta (afterbirth) remain in the body.  There are multiple treatment options for an incomplete miscarriage, talk to your health care provider about the best option for you.  Follow your health care provider's instructions for follow-up care.  To help you and your partner with the process of grieving, talk with your health care provider or seek counseling to help cope with the pregnancy loss. This information is not intended to replace advice given to you by your health care provider. Make sure you discuss any questions you have with your health care provider. Document Revised: 11/23/2017 Document Reviewed: 11/23/2016 Elsevier Patient Education  2020 [employer].

## 2019-12-28 NOTE — Progress Notes (Signed)
Subjective: Patient reports tolerating PO and no problems voiding. Nausea Is resolving Objective: I have reviewed patient's vital signs, labs and radiology results. CLINICAL DATA:  24 year old pregnant female with pelvic cramping and vaginal bleeding. LMP: 09/28/2019 corresponding to an estimated gestational age of [redacted] weeks, 3 days.  EXAM: TRANSVAGINAL OB ULTRASOUND  TECHNIQUE: Transvaginal ultrasound was performed for complete evaluation of the gestation as well as the maternal uterus, adnexal regions, and pelvic cul-de-sac.  COMPARISON:  Pelvic ultrasound dated 12/09/2019.  FINDINGS: There is a 2.5 x 0.6 x 1.5 cm complex tissue with solid and cystic component in the endocervical region which may represent blood clot or correspond to the cystic/sac-like structure seen on the prior ultrasound. The overall appearance most concerning for miscarriage in progress. Retained products of conception is not excluded. Correlation with clinical exam and serial HCG levels and follow-up with ultrasound as clinically indicated.  The ovaries are unremarkable.  Small free fluid within the pelvis.  IMPRESSION: Findings most consistent with miscarriage in progress. Retained product of conception is not entirely excluded. Clinical correlation and follow-up recommended.   Electronically Signed   By: Elgie Collard M.D.   On: 12/24/2019 19:53 General: alert, cooperative and mild distress GI: soft, non-tender; bowel sounds normal; no masses,  no organomegaly Extremities: extremities normal, atraumatic, no cyanosis or edema and Homans sign is negative, no sign of DVT Vaginal Bleeding: minimal   CBC post second unit is ordered.  Assessment/Plan: Will repeat  Cbc,  Plan on D/C today, and keep on Cipro and Flagyl for 1 week.  LOS: 1 day    Tilda Burrow 12/28/2019, 2:59 PM

## 2019-12-28 NOTE — Progress Notes (Signed)
Nsg Discharge Note  Admit Date:  12/26/2019 Discharge date: 12/28/2019   Iran Planas Cobb to be D/C'd Home per MD order.  AVS completed.  Patient able to verbalize understanding.  Discharge Medication: Allergies as of 12/28/2019   No Known Allergies     Medication List    TAKE these medications   acetaminophen 500 MG tablet Commonly known as: TYLENOL Take 500 mg by mouth every 6 (six) hours as needed for mild pain. What changed: Another medication with the same name was added. Make sure you understand how and when to take each.   acetaminophen 325 MG tablet Commonly known as: TYLENOL Take 2 tablets (650 mg total) by mouth every 6 (six) hours as needed for fever or headache. What changed: You were already taking a medication with the same name, and this prescription was added. Make sure you understand how and when to take each.   ciprofloxacin 500 MG tablet Commonly known as: Cipro Take 1 tablet (500 mg total) by mouth 2 (two) times daily.   HYDROcodone-acetaminophen 5-325 MG tablet Commonly known as: NORCO/VICODIN Take 1-2 tablets by mouth every 6 (six) hours as needed for up to 3 days for moderate pain or severe pain.   ondansetron 4 MG disintegrating tablet Commonly known as: Zofran ODT Take 1 tablet (4 mg total) by mouth every 6 (six) hours as needed for nausea.   tiZANidine 4 MG tablet Commonly known as: ZANAFLEX Take 4 mg by mouth 2 (two) times daily as needed.       Discharge Assessment: Vitals:   12/28/19 0557 12/28/19 1707  BP: (!) 109/56 (!) 113/59  Pulse: 77 63  Resp:    Temp: 99.8 F (37.7 C) 98.6 F (37 C)  SpO2: 99% 100%   Skin clean, dry and intact without evidence of skin break down, no evidence of skin tears noted. IV catheter discontinued intact. Site without signs and symptoms of complications - no redness or edema noted at insertion site, patient denies c/o pain - only slight tenderness at site.  Dressing with slight pressure applied.  D/c  Instructions-Education: Discharge instructions given to patient with verbalized understanding. D/c education completed with patient including follow up instructions, medication list, d/c activities limitations if indicated, with other d/c instructions as indicated by MD - patient able to verbalize understanding, all questions fully answered. Patient instructed to return to ED, call 911, or call MD for any changes in condition.  Patient escorted via WC, and D/C home via private auto.  Justyce Baby Salena Saner, RN 12/28/2019 6:01 PM

## 2019-12-30 ENCOUNTER — Ambulatory Visit (INDEPENDENT_AMBULATORY_CARE_PROVIDER_SITE_OTHER): Payer: 59 | Admitting: Adult Health

## 2019-12-30 ENCOUNTER — Encounter: Payer: Self-pay | Admitting: Adult Health

## 2019-12-30 ENCOUNTER — Other Ambulatory Visit: Payer: Self-pay

## 2019-12-30 VITALS — BP 111/66 | HR 84 | Ht 64.0 in | Wt 118.0 lb

## 2019-12-30 DIAGNOSIS — Z862 Personal history of diseases of the blood and blood-forming organs and certain disorders involving the immune mechanism: Secondary | ICD-10-CM | POA: Diagnosis not present

## 2019-12-30 DIAGNOSIS — Z30011 Encounter for initial prescription of contraceptive pills: Secondary | ICD-10-CM

## 2019-12-30 DIAGNOSIS — O039 Complete or unspecified spontaneous abortion without complication: Secondary | ICD-10-CM

## 2019-12-30 LAB — SURGICAL PATHOLOGY

## 2019-12-30 MED ORDER — NORGESTIMATE-ETH ESTRADIOL 0.25-35 MG-MCG PO TABS: 1.0000 | ORAL_TABLET | Freq: Every day | ORAL | 11 refills | Status: DC

## 2019-12-30 NOTE — Progress Notes (Signed)
  Subjective:     Patient ID: Caroline Good, female   DOB: 11-23-1995, 24 y.o.   MRN: [id number]  HPI Caroline Good is a 24 year old white female, single, sp miscarriage in for follow up.She had mor bleeding and pain and fever and was seen twice in [facility]U 2/23 and 2/26, On the 2/26 visit, was admitted for overnight and had IV fluids and given 2 units of blood and antibiotics, and had manuel extraction of POC. PCP is Dr Phillips Odor.   Review of Systems Light spotting  No pain No sex yet Denies any dizziness or shortness of breath  Reviewed past medical,surgical, social and family history. Reviewed medications and allergies.     Objective:   Physical Exam BP 111/66 (BP Location: Left Arm, Patient Position: Sitting, Cuff Size: Normal)   Pulse 84   Ht 5\' 4"  (1.626 m)   Wt 118 lb (53.5 kg)   LMP 09/28/2019 (Exact Date) Comment: pt had miscarriage 2 days ago on 12/13/19  Breastfeeding Unknown   BMI 20.25 kg/m  Skin warm and dry. Lungs: clear to ausculation bilaterally. Cardiovascular: regular rate and rhythm.Abdomen is soft and non tender. Fall risk is low She does want OCs.     Assessment:     1. Miscarriage Check QHCG  2. History of anemia Check CBC  3. Encounter for initial prescription of contraceptive pills Will rx sprintec, and get and hold til advised to start Meds ordered this encounter  Medications  . norgestimate-ethinyl estradiol (ORTHO-CYCLEN) 0.25-35 MG-MCG tablet    Sig: Take 1 tablet by mouth daily.    Dispense:  1 Package    Refill:  11    Order Specific Question:   Supervising Provider    Answer:   07-17[birth year]1999 [[id number]]      Plan:     Follow up in 3 months

## 2019-12-31 ENCOUNTER — Other Ambulatory Visit: Payer: Self-pay | Admitting: Adult Health

## 2019-12-31 DIAGNOSIS — O039 Complete or unspecified spontaneous abortion without complication: Secondary | ICD-10-CM

## 2019-12-31 LAB — BETA HCG QUANT (REF LAB): hCG Quant: 63 m[IU]/mL

## 2019-12-31 LAB — CBC
Hematocrit: 33.3 % — ABNORMAL LOW (ref 34.0–46.6)
Hemoglobin: 11.6 g/dL (ref 11.1–15.9)
MCH: 30.5 pg (ref 26.6–33.0)
MCHC: 34.8 g/dL (ref 31.5–35.7)
MCV: 88 fL (ref 79–97)
Platelets: 267 10*3/uL (ref 150–450)
RBC: 3.8 x10E6/uL (ref 3.77–5.28)
RDW: 12.4 % (ref 11.7–15.4)
WBC: 5.2 10*3/uL (ref 3.4–10.8)

## 2020-01-01 LAB — CULTURE, BLOOD (ROUTINE X 2)
Culture: NO GROWTH
Culture: NO GROWTH
Special Requests: ADEQUATE

## 2020-03-31 ENCOUNTER — Ambulatory Visit (INDEPENDENT_AMBULATORY_CARE_PROVIDER_SITE_OTHER): Payer: 59 | Admitting: Adult Health

## 2020-03-31 ENCOUNTER — Encounter: Payer: Self-pay | Admitting: Adult Health

## 2020-03-31 VITALS — BP 109/70 | HR 80 | Ht 64.0 in | Wt 116.5 lb

## 2020-03-31 DIAGNOSIS — Z8759 Personal history of other complications of pregnancy, childbirth and the puerperium: Secondary | ICD-10-CM

## 2020-03-31 DIAGNOSIS — Z319 Encounter for procreative management, unspecified: Secondary | ICD-10-CM | POA: Diagnosis not present

## 2020-03-31 MED ORDER — PRENATAL VITAMINS 28-0.8 MG PO TABS: ORAL_TABLET | ORAL | Status: DC

## 2020-03-31 NOTE — Progress Notes (Signed)
  Subjective:     Patient ID: Caroline Good, female   DOB: 09/30/1996, 24 y.o.   MRN: [id number]  HPI Caroline Good is a 24 year old white female, married, [id number], back in follow up of having a miscarriage in February, took OCs x 1 month, periods regular and she had a pap 03/19/20 at [place]. She wants to get pregnant again.  PCP is [place].  Review of Systems Periods regular Denies any depression Had pap 03/19/20 at [facility], showed dysplasia she says, and getting repeat pap in 6 months at [facility]  Reviewed past medical,surgical, social and family history. Reviewed medications and allergies.     Objective:   Physical Exam BP 109/70 (BP Location: Left Arm, Patient Position: Sitting, Cuff Size: Normal)   Pulse 80   Ht 5\' 4"  (1.626 m)   Wt 116 lb 8 oz (52.8 kg)   LMP 03/25/2020 (Exact Date) Comment: pt had miscarriage 2 days ago on 12/13/19  BMI 20.00 kg/m  Skin warm and dry. Neck: mid line trachea, normal thyroid, good ROM, no lymphadenopathy noted. Lungs: clear to ausculation bilaterally. Cardiovascular: regular rate and rhythm.    Assessment:     1. History of miscarriage  2. Patient desires pregnancy   Continue PNV Discussed timing of sex, and she is aware could take 6-18 months of active trying   Plan:     Call when gets +pregnancy test

## 2020-07-16 ENCOUNTER — Encounter (HOSPITAL_COMMUNITY): Payer: Self-pay

## 2020-10-26 ENCOUNTER — Telehealth: Payer: Self-pay | Admitting: Adult Health

## 2020-10-26 NOTE — Telephone Encounter (Signed)
Patient called twice wanting to know if our office has received her [facility] If not, the patient stated that our office can contact [facility].

## 2020-11-02 ENCOUNTER — Encounter: Payer: 59 | Admitting: Women's Health

## 2020-11-04 ENCOUNTER — Encounter (HOSPITAL_COMMUNITY): Payer: Self-pay | Admitting: Emergency Medicine

## 2020-11-04 ENCOUNTER — Emergency Department (HOSPITAL_COMMUNITY)
Admission: EM | Admit: 2020-11-04 | Discharge: 2020-11-04 | Disposition: A | Discharge status: Home / Self Care | Payer: 59 | Attending: Emergency Medicine | Admitting: Emergency Medicine

## 2020-11-04 ENCOUNTER — Other Ambulatory Visit: Payer: Self-pay

## 2020-11-04 DIAGNOSIS — U071 COVID-19: Secondary | ICD-10-CM | POA: Insufficient documentation

## 2020-11-04 DIAGNOSIS — J069 Acute upper respiratory infection, unspecified: Secondary | ICD-10-CM

## 2020-11-04 DIAGNOSIS — F17290 Nicotine dependence, other tobacco product, uncomplicated: Secondary | ICD-10-CM | POA: Insufficient documentation

## 2020-11-04 DIAGNOSIS — M791 Myalgia, unspecified site: Secondary | ICD-10-CM | POA: Diagnosis present

## 2020-11-04 MED ORDER — ACETAMINOPHEN 500 MG PO TABS
1000.0000 mg | ORAL_TABLET | Freq: Once | ORAL | Status: AC
  Administered 2020-11-04: 1000 mg via ORAL
  Filled 2020-11-04: qty 2

## 2020-11-04 NOTE — ED Triage Notes (Signed)
PT c/o emesis with 8-9 wk pregnancy and has vomitted twice today.  Pt states she has a cough and a sore throat that began yesterday and was exposed to covid at work on Monday 11/02/2020

## 2020-11-04 NOTE — ED Provider Notes (Signed)
[facility] EMERGENCY DEPARTMENT Provider Note   CSN: [id number] Arrival date & time: 11/04/20  1313     History Chief Complaint  Patient presents with  . Emesis    Caroline Good is a 25 y.o. female.  Patient c/o general body aches, congestion, non productive cough. Symptoms acute onset in past 1-2 days. Recent exposure to someone w uri symptoms at work ?covid. Had episode nausea/vomiting earlier, not bloody or bilious. No abd pain. No diarrhea. No sob or chest pain. No severe headaches. No neck pain or stiffness. Is 8-[redacted] weeks pregnant. No vaginal discharge or bleeding.   The history is provided by the patient.  Emesis Associated symptoms: cough, fever and myalgias   Associated symptoms: no abdominal pain, no headaches and no sore throat        Past Medical History:  Diagnosis Date  . Anxiety   . Miscarriage     Patient Active Problem List   Diagnosis Date Noted  . History of miscarriage 03/31/2020  . History of anemia 12/30/2019  . Encounter for initial prescription of contraceptive pills 12/30/2019  . Spontaneous abortion with septicemia 12/27/2019  . Miscarriage 12/16/2019    Past Surgical History:  Procedure Laterality Date  . TONSILLECTOMY       OB History    Gravida  2   Para  0   Term  0   Preterm  0   AB  1   Living  0     SAB  1   IAB  0   Ectopic  0   Multiple  0   Live Births  0           Family History  Problem Relation Age of Onset  . Heart disease Paternal Grandfather   . Diabetes Paternal Grandfather   . Heart attack Maternal Grandfather   . Miscarriages / Stillbirths Father   . Miscarriages / [place] Mother     Social History   Tobacco Use  . Smoking status: Current Every Day Smoker    Types: E-cigarettes  . Smokeless tobacco: Never Used  . Tobacco comment: denies  Vaping Use  . Vaping Use: Every day  . Devices: denies  Substance Use Topics  . Alcohol use: No  . Drug use: No    Home  Medications Prior to Admission medications   Medication Sig Start Date End Date Taking? Authorizing Provider  acetaminophen (TYLENOL) 500 MG tablet Take 500 mg by mouth every 6 (six) hours as needed for mild pain.    [provider]  Prenatal Vit-Fe Fumarate-FA (PRENATAL VITAMINS) 28-0.8 MG TABS Take 1 daily 03/31/20   Cyril Mourning A, NP  tiZANidine (ZANAFLEX) 4 MG tablet Take 4 mg by mouth 2 (two) times daily as needed. 12/20/19   [provider]    Allergies    Patient has no known allergies.  Review of Systems   Review of Systems  Constitutional: Positive for fever.  HENT: Positive for congestion. Negative for sore throat.   Eyes: Negative for redness.  Respiratory: Positive for cough. Negative for shortness of breath.   Cardiovascular: Negative for chest pain.  Gastrointestinal: Positive for vomiting. Negative for abdominal pain.  Genitourinary: Negative for dysuria and flank pain.  Musculoskeletal: Positive for myalgias. Negative for back pain, neck pain and neck stiffness.  Skin: Negative for rash.  Neurological: Negative for headaches.  Hematological: Does not bruise/bleed easily.  Psychiatric/Behavioral: Negative for confusion.    Physical Exam Updated Vital Signs BP  119/63 (BP Location: Right Arm)   Pulse 100   Temp 99 F (37.2 C) (Oral)   Resp 18   Wt 53.8 kg   LMP 09/21/2020 (Approximate)   SpO2 100%   BMI 20.37 kg/m   Physical Exam Vitals and nursing note reviewed.  Constitutional:      Appearance: Normal appearance. She is well-developed.  HENT:     Head: Atraumatic.     Nose: Nose normal.     Mouth/Throat:     Mouth: Mucous membranes are moist.     Pharynx: Oropharynx is clear. No oropharyngeal exudate or posterior oropharyngeal erythema.  Eyes:     General: No scleral icterus.    Conjunctiva/sclera: Conjunctivae normal.  Neck:     Trachea: No tracheal deviation.     Comments: No stiffness or rigidity Cardiovascular:     Rate  and Rhythm: Normal rate and regular rhythm.     Pulses: Normal pulses.     Heart sounds: Normal heart sounds. No murmur heard. No friction rub. No gallop.   Pulmonary:     Effort: Pulmonary effort is normal. No respiratory distress.     Breath sounds: Normal breath sounds.  Abdominal:     General: Bowel sounds are normal. There is no distension.     Palpations: Abdomen is soft.     Tenderness: There is no abdominal tenderness.  Genitourinary:    Comments: No cva tenderness.  Musculoskeletal:        General: No swelling.     Cervical back: Normal range of motion and neck supple. No rigidity. No muscular tenderness.  Skin:    General: Skin is warm and dry.     Findings: No rash.  Neurological:     Mental Status: She is alert.     Comments: Alert, speech normal. Steady gait.   Psychiatric:        Mood and Affect: Mood normal.     ED Results / Procedures / Treatments   Labs (all labs ordered are listed, but only abnormal results are displayed) Labs Reviewed  SARS CORONAVIRUS 2 (TAT 6-24 HRS)    EKG None  Radiology No results found.  Procedures Procedures (including critical care time)  Medications Ordered in ED Medications  acetaminophen (TYLENOL) tablet 1,000 mg (has no administration in time range)    ED Course  I have reviewed the triage vital signs and the nursing notes.  Pertinent labs & imaging results that were available during my care of the patient were reviewed by me and considered in my medical decision making (see chart for details).    MDM Rules/Calculators/A&P                         Labs sent.   Reviewed nursing notes and prior charts for additional history.   Po fluids. Acetaminophen po.   Recheck  - covid swab result remains pending.   Pt is tolerating po. No recurrent nv. No abd pain. No increased wob, and pulse ox 99-100% on room air.   Pt currently appears stable for d/c.  Return precautions provided.      Final Clinical  Impression(s) / ED Diagnoses Final diagnoses:  None    Rx / DC Orders ED Discharge Orders    None       Cathren Laine, MD 11/04/20 1724

## 2020-11-04 NOTE — Discharge Instructions (Addendum)
It was our pleasure to provide your ER care today - we hope that you feel better.  May sure to stay well hydrated, and get adequate nutrition. Stay active, take full and deep breaths.   Your covid/flu test result is pending - you may check MyChart or call back for results.   You may take acetaminophen as need for fever/aches.   Return to ER if worse, new symptoms, increased trouble breathing, persistent vomiting, or other concern.

## 2020-11-05 ENCOUNTER — Telehealth (HOSPITAL_COMMUNITY): Payer: Self-pay

## 2020-11-05 LAB — SARS CORONAVIRUS 2 (TAT 6-24 HRS): SARS Coronavirus 2: POSITIVE — AB

## 2020-11-16 ENCOUNTER — Other Ambulatory Visit: Payer: 59

## 2020-11-20 ENCOUNTER — Other Ambulatory Visit: Payer: Self-pay | Admitting: Obstetrics & Gynecology

## 2020-11-20 DIAGNOSIS — O3680X0 Pregnancy with inconclusive fetal viability, not applicable or unspecified: Secondary | ICD-10-CM

## 2020-11-23 ENCOUNTER — Ambulatory Visit (INDEPENDENT_AMBULATORY_CARE_PROVIDER_SITE_OTHER): Payer: 59

## 2020-11-23 ENCOUNTER — Other Ambulatory Visit: Payer: Self-pay

## 2020-11-23 DIAGNOSIS — Z3A09 9 weeks gestation of pregnancy: Secondary | ICD-10-CM

## 2020-11-23 DIAGNOSIS — O3680X0 Pregnancy with inconclusive fetal viability, not applicable or unspecified: Secondary | ICD-10-CM

## 2020-11-23 NOTE — Progress Notes (Signed)
[place] 9+4 wks,single IUP with ys,fhr 150 bpm,normal ovaries,crl 35.22 mm

## 2020-12-09 ENCOUNTER — Other Ambulatory Visit: Payer: Self-pay | Admitting: Obstetrics & Gynecology

## 2020-12-09 DIAGNOSIS — Z3682 Encounter for antenatal screening for nuchal translucency: Secondary | ICD-10-CM

## 2020-12-10 ENCOUNTER — Other Ambulatory Visit (HOSPITAL_COMMUNITY)
Admission: RE | Admit: 2020-12-10 | Discharge: 2020-12-10 | Disposition: A | Discharge status: Home / Self Care | Payer: 59 | Source: Ambulatory Visit | Attending: Advanced Practice Midwife | Admitting: Advanced Practice Midwife

## 2020-12-10 ENCOUNTER — Ambulatory Visit (INDEPENDENT_AMBULATORY_CARE_PROVIDER_SITE_OTHER): Payer: 59

## 2020-12-10 ENCOUNTER — Ambulatory Visit: Payer: 59 | Admitting: *Deleted

## 2020-12-10 ENCOUNTER — Ambulatory Visit (INDEPENDENT_AMBULATORY_CARE_PROVIDER_SITE_OTHER): Payer: 59 | Admitting: Advanced Practice Midwife

## 2020-12-10 ENCOUNTER — Encounter: Payer: Self-pay | Admitting: Advanced Practice Midwife

## 2020-12-10 ENCOUNTER — Other Ambulatory Visit: Payer: Self-pay

## 2020-12-10 VITALS — BP 97/60 | HR 88 | Wt 119.0 lb

## 2020-12-10 DIAGNOSIS — Z3A12 12 weeks gestation of pregnancy: Secondary | ICD-10-CM

## 2020-12-10 DIAGNOSIS — O0990 Supervision of high risk pregnancy, unspecified, unspecified trimester: Secondary | ICD-10-CM | POA: Insufficient documentation

## 2020-12-10 DIAGNOSIS — Z3480 Encounter for supervision of other normal pregnancy, unspecified trimester: Secondary | ICD-10-CM | POA: Insufficient documentation

## 2020-12-10 DIAGNOSIS — Z3490 Encounter for supervision of normal pregnancy, unspecified, unspecified trimester: Secondary | ICD-10-CM | POA: Insufficient documentation

## 2020-12-10 DIAGNOSIS — Z3682 Encounter for antenatal screening for nuchal translucency: Secondary | ICD-10-CM

## 2020-12-10 HISTORY — DX: Encounter for supervision of normal pregnancy, unspecified, unspecified trimester: Z34.90

## 2020-12-10 LAB — POCT URINALYSIS DIPSTICK OB
Blood, UA: NEGATIVE
Glucose, UA: NEGATIVE
Leukocytes, UA: NEGATIVE
Nitrite, UA: NEGATIVE
POC,PROTEIN,UA: NEGATIVE

## 2020-12-10 NOTE — Progress Notes (Signed)
[place] 12 wks,measurements c/w dates,NB present,NT 1.5 mm,CRL 61.43 mm,anterior placenta,normal ovaries

## 2020-12-10 NOTE — Progress Notes (Signed)
INITIAL OBSTETRICAL VISIT Patient name: Caroline Good MRN [id number]  Date of birth: 07-20-1996 Chief Complaint:   Initial Prenatal Visit (Nt/it)  History of Present Illness:   Caroline Good is a 25 y.o. G8P0010 Caucasian female at [redacted]w[redacted]d by LMP c/w u/s at 10 weeks with an Estimated Date of Delivery: 06/24/21 being seen today for her initial obstetrical visit.   Her obstetrical history is significant for early SAB w/septicemia.  She has a hx of panic attacks where she took prn meds, none in quite a while. Today she reports no complaints.  Depression screen [facility] 2/9 12/10/2020 03/31/2020 08/17/2017  Decreased Interest 1 0 0  Down, Depressed, Hopeless 0 0 0  PHQ - 2 Score 1 0 0  Altered sleeping 2 - -  Tired, decreased energy 1 - -  Change in appetite 0 - -  Feeling bad or failure about yourself  0 - -  Trouble concentrating 0 - -  Moving slowly or fidgety/restless 0 - -  Suicidal thoughts 0 - -  PHQ-9 Score 4 - -    Patient's last menstrual period was 09/17/2020 (exact date). Last pap never. Review of Systems:   Pertinent items are noted in HPI Denies cramping/contractions, leakage of fluid, vaginal bleeding, abnormal vaginal discharge w/ itching/odor/irritation, headaches, visual changes, shortness of breath, chest pain, abdominal pain, severe nausea/vomiting, or problems with urination or bowel movements unless otherwise stated above.  Pertinent History Reviewed:  Reviewed past medical,surgical, social, obstetrical and family history.  Reviewed problem list, medications and allergies. OB History  Gravida Para Term Preterm AB Living  2 0 0 0 1 0  SAB IAB Ectopic Multiple Live Births  1 0 0 0 0    # Outcome Date GA Lbr Len/2nd Weight Sex Delivery Anes PTL Lv  2 Current           1 SAB            Physical Assessment:   Vitals:   12/10/20 1350  BP: 97/60  Pulse: 88  Weight: 119 lb (54 kg)  Body mass index is 20.43 kg/m.       Physical Examination:  General  appearance - well appearing, and in no distress  Mental status - alert, oriented to person, place, and time  Psych:  She has a normal mood and affect  Skin - warm and dry, normal color, no suspicious lesions noted  Chest - effort normal  Heart - normal rate and regular rhythm  Abdomen - soft, nontender  Extremities:  No swelling or varicosities noted  Pelvic - VULVA: normal appearing vulva with no masses, tenderness or lesions  VAGINA: normal appearing vagina with normal color and discharge, no lesions  CERVIX: normal appearing cervix without discharge or lesions, no CMT  Thin prep pap is done with HR HPV cotesting  TODAY'S NT [place] 12 wks,measurements c/w dates,NB present,NT 1.5 mm,CRL 61.43 mm,anterior placenta,normal ovaries  Results for orders placed or performed in visit on 12/10/20 (from the past 24 hour(s))  POC Urinalysis Dipstick OB   Collection Time: 12/10/20  2:38 PM  Result Value Ref Range   Color, UA     Clarity, UA     Glucose, UA Negative Negative   Bilirubin, UA     Ketones, UA moderate    Spec Grav, UA     Blood, UA neg    pH, UA     POC,PROTEIN,UA Negative Negative, Trace, Small (1+), Moderate (2+), Large (3+), 4+  Urobilinogen, UA     Nitrite, UA neg    Leukocytes, UA Negative Negative   Appearance     Odor      Assessment & Plan:  1) Low-Risk Pregnancy G2P0010 at [redacted]w[redacted]d with an Estimated Date of Delivery: 06/24/21   2) Initial OB visit  3) anxiety:  Let us know if panic attacks resume  Meds: No orders of the defined types were placed in this encounter.   Initial labs obtained Continue prenatal vitamins Reviewed n/v relief measures and warning s/s to report Reviewed recommended weight gain based on pre-gravid BMI Encouraged well-balanced diet Genetic & carrier screening discussed: requests Panorama, NT/IT and Horizon 14 , declines AFP Ultrasound discussed; fetal survey: requested CCNC completed> form faxed if has or is planning to apply for  medicaid The nature of [facility] [employer] with multiple MDs and other Advanced Practice Providers was explained to patient; also emphasized that fellows, residents, and students are part of our team. has home bp cuff. Check bp weekly, let us know if >140/90.        Jacklyn Shell 2:39 PM

## 2020-12-12 LAB — INTEGRATED 1
Crown Rump Length: 61.4 mm
Gest. Age on Collection Date: 12.4 weeks
Maternal Age at EDD: 22.9 yr
Nuchal Translucency (NT): 1.5 mm
Number of Fetuses: 1
PAPP-A Value: 1600.2 ng/mL
Weight: 119 [lb_av]

## 2020-12-12 LAB — CBC/D/PLT+RPR+RH+ABO+RUB AB...
Antibody Screen: NEGATIVE
Basophils Absolute: 0 10*3/uL (ref 0.0–0.2)
Basos: 0 %
EOS (ABSOLUTE): 0.1 10*3/uL (ref 0.0–0.4)
Eos: 1 %
HCV Ab: <0.1 s/co ratio (ref 0.0–0.9)
HIV Screen 4th Generation wRfx: NONREACTIVE
Hematocrit: 35 % (ref 34.0–46.6)
Hemoglobin: 12.3 g/dL (ref 11.1–15.9)
Hepatitis B Surface Ag: NEGATIVE
Immature Grans (Abs): 0.1 10*3/uL (ref 0.0–0.1)
Immature Granulocytes: 1 %
Lymphocytes Absolute: 1.5 10*3/uL (ref 0.7–3.1)
Lymphs: 16 %
MCH: 32.9 pg (ref 26.6–33.0)
MCHC: 35.1 g/dL (ref 31.5–35.7)
MCV: 94 fL (ref 79–97)
Monocytes Absolute: 0.5 10*3/uL (ref 0.1–0.9)
Monocytes: 6 %
Neutrophils Absolute: 6.8 10*3/uL (ref 1.4–7.0)
Neutrophils: 76 %
Platelets: 196 10*3/uL (ref 150–450)
RBC: 3.74 x10E6/uL — ABNORMAL LOW (ref 3.77–5.28)
RDW: 12.7 % (ref 11.7–15.4)
RPR Ser Ql: NONREACTIVE
Rh Factor: POSITIVE
Rubella Antibodies, IGG: 2.87 index (ref >0.99)
WBC: 9 10*3/uL (ref 3.4–10.8)

## 2020-12-12 LAB — MED LIST OPTION NOT SELECTED

## 2020-12-12 LAB — HCV INTERPRETATION

## 2020-12-13 LAB — URINE CULTURE

## 2020-12-14 LAB — PMP SCREEN PROFILE (10S), URINE
Amphetamine Scrn, Ur: NEGATIVE ng/mL
BARBITURATE SCREEN URINE: NEGATIVE ng/mL
BENZODIAZEPINE SCREEN, URINE: NEGATIVE ng/mL
CANNABINOIDS UR QL SCN: NEGATIVE ng/mL
Cocaine (Metab) Scrn, Ur: NEGATIVE ng/mL
Creatinine(Crt), U: 176.6 mg/dL (ref 20.0–300.0)
Methadone Screen, Urine: NEGATIVE ng/mL
OXYCODONE+OXYMORPHONE UR QL SCN: NEGATIVE ng/mL
Opiate Scrn, Ur: NEGATIVE ng/mL
Ph of Urine: 6.9 (ref 4.5–8.9)
Phencyclidine Qn, Ur: NEGATIVE ng/mL
Propoxyphene Scrn, Ur: NEGATIVE ng/mL

## 2020-12-16 LAB — CYTOLOGY - PAP
Chlamydia: NEGATIVE
Comment: NEGATIVE
Comment: NEGATIVE
Comment: NORMAL
Diagnosis: UNDETERMINED — AB
High risk HPV: NEGATIVE
Neisseria Gonorrhea: NEGATIVE

## 2021-01-07 ENCOUNTER — Ambulatory Visit: Payer: 59 | Admitting: Obstetrics & Gynecology

## 2021-01-07 ENCOUNTER — Encounter: Payer: Self-pay | Admitting: Obstetrics & Gynecology

## 2021-01-07 ENCOUNTER — Other Ambulatory Visit: Payer: Self-pay

## 2021-01-07 VITALS — BP 101/57 | HR 85 | Wt 125.4 lb

## 2021-01-07 DIAGNOSIS — Z1379 Encounter for other screening for genetic and chromosomal anomalies: Secondary | ICD-10-CM

## 2021-01-07 DIAGNOSIS — Z3480 Encounter for supervision of other normal pregnancy, unspecified trimester: Secondary | ICD-10-CM

## 2021-01-07 NOTE — Progress Notes (Signed)
    LOW-RISK PREGNANCY VISIT Patient name: Caroline Good MRN [id number]  Date of birth: [birth year] Chief Complaint:   Routine Prenatal Visit (2nd IT)  History of Present Illness:   Caroline Good is a 25 y.o. G2P0010 female at [redacted]w[redacted]d with an Estimated Date of Delivery: 06/24/21 being seen today for ongoing management of a low-risk pregnancy.   Depression screen [facility] 2/9 12/10/2020 03/31/2020 08/17/2017  Decreased Interest 1 0 0  Down, Depressed, Hopeless 0 0 0  PHQ - 2 Score 1 0 0  Altered sleeping 2 - -  Tired, decreased energy 1 - -  Change in appetite 0 - -  Feeling bad or failure about yourself  0 - -  Trouble concentrating 0 - -  Moving slowly or fidgety/restless 0 - -  Suicidal thoughts 0 - -  PHQ-9 Score 4 - -    Today she reports no complaints. Contractions: Not present.  .   . denies leaking of fluid.  Not yet feeling fetal movmenet.  No vaginal bleeding.  Review of Systems:   Pertinent items are noted in HPI Denies abnormal vaginal discharge w/ itching/odor/irritation, headaches, visual changes, shortness of breath, chest pain, abdominal pain, severe nausea/vomiting, or problems with urination or bowel movements unless otherwise stated above. Pertinent History Reviewed:  Reviewed past medical,surgical, social, obstetrical and family history.  Reviewed problem list, medications and allergies.  Physical Assessment:   Vitals:   01/07/21 1413  BP: (!) 101/57  Pulse: 85  Weight: 125 lb 6.4 oz (56.9 kg)  Body mass index is 21.52 kg/m.        Physical Examination:   General appearance: Well appearing, and in no distress  Mental status: Alert, oriented to person, place, and time  Skin: Warm & dry  Respiratory: Normal respiratory effort, no distress  Abdomen: Soft, gravid, nontender  Pelvic: Cervical exam deferred         Extremities: Edema: None  Psych:  mood and affect appropriate  Fetal Status: Fetal Heart Rate (bpm): 140       Fundal height: below  umbilicus Chaperone: n/a    No results found for this or any previous visit (from the past 24 hour(s)).   Assessment & Plan:  1) Low-risk pregnancy G2P0010 at [redacted]w[redacted]d with an Estimated Date of Delivery: 06/24/21    Meds: No orders of the defined types were placed in this encounter.  Labs/procedures today: IT-2  Plan:  Continue routine obstetrical care Next visit: prefers in person for anatomy scan and OB visit  Reviewed: Preterm labor symptoms and general obstetric precautions including but not limited to vaginal bleeding, contractions, leaking of fluid and fetal movement were reviewed in detail with the patient.  All questions were answered. Pt has home bp cuff. Check bp weekly, let us know if >140/90.   Follow-up: Return in about 4 weeks (around 02/04/2021) for LROB visit and anatomy scan.  Orders Placed This Encounter  Procedures  . INTEGRATED 2    Myna Hidalgo, DO Attending Obstetrician & Gynecologist, [facility] for [employer], [facility]

## 2021-01-09 LAB — INTEGRATED 2
AFP MoM: 1.29
Alpha-Fetoprotein: 49.9 ng/mL
Crown Rump Length: 61.4 mm
DIA MoM: 1.51
DIA Value: 276.6 pg/mL
Estriol, Unconjugated: 1.21 ng/mL
Gest. Age on Collection Date: 12.4 weeks
Gestational Age: 16.4 weeks
Maternal Age at EDD: 22.9 yr
Nuchal Translucency (NT): 1.5 mm
Nuchal Translucency MoM: 1.09
Number of Fetuses: 1
PAPP-A MoM: 1.26
PAPP-A Value: 1600.2 ng/mL
Test Results:: NEGATIVE
Weight: 119 [lb_av]
Weight: 119 [lb_av]
hCG MoM: 1.25
hCG Value: 47.1 IU/mL
uE3 MoM: 1.12

## 2021-01-26 ENCOUNTER — Encounter: Payer: Self-pay | Admitting: Advanced Practice Midwife

## 2021-02-05 ENCOUNTER — Other Ambulatory Visit: Payer: Self-pay | Admitting: Obstetrics & Gynecology

## 2021-02-05 DIAGNOSIS — Z363 Encounter for antenatal screening for malformations: Secondary | ICD-10-CM

## 2021-02-08 ENCOUNTER — Encounter: Payer: Self-pay | Admitting: Women's Health

## 2021-02-08 ENCOUNTER — Ambulatory Visit (INDEPENDENT_AMBULATORY_CARE_PROVIDER_SITE_OTHER): Payer: 59 | Admitting: Women's Health

## 2021-02-08 ENCOUNTER — Ambulatory Visit (INDEPENDENT_AMBULATORY_CARE_PROVIDER_SITE_OTHER): Payer: 59

## 2021-02-08 ENCOUNTER — Other Ambulatory Visit: Payer: Self-pay

## 2021-02-08 VITALS — BP 99/56 | HR 69 | Wt 130.0 lb

## 2021-02-08 DIAGNOSIS — R87619 Unspecified abnormal cytological findings in specimens from cervix uteri: Secondary | ICD-10-CM | POA: Insufficient documentation

## 2021-02-08 DIAGNOSIS — Z3482 Encounter for supervision of other normal pregnancy, second trimester: Secondary | ICD-10-CM

## 2021-02-08 DIAGNOSIS — Z3480 Encounter for supervision of other normal pregnancy, unspecified trimester: Secondary | ICD-10-CM

## 2021-02-08 DIAGNOSIS — Z363 Encounter for antenatal screening for malformations: Secondary | ICD-10-CM

## 2021-02-08 NOTE — Patient Instructions (Signed)
Caroline Good, I greatly value your feedback.  If you receive a survey following your visit with [place] today, we appreciate you taking the time to fill it out.  Thanks, Joellyn Haff, CNM, WHNP-BC  [facility] ([address] [place], [place] [address]) Entrance C, located off of [address] valet parking  Go to [email or web address] to register for FREE online childbirth classes  [place] Pediatricians/Family Doctors:  [facility] [phone]            [facility] [phone]                 [facility] [phone] (usually not accepting new patients unless you have family there already, you are always welcome to call and ask)       [facility] Department [phone]       [facility] Pediatricians/Family Doctors:   [facility]: [phone]  [facility]: [phone]  [facility]: [phone]  [facility] Doctors:   [facility]: [phone]   [facility]: [phone]  [facility] Doctors:  [facility]: [phone]    Home Blood Pressure Monitoring for Patients   Your provider has recommended that you check your blood pressure (BP) at least once a week at home. If you do not have a blood pressure cuff at home, one will be provided for you. Contact your provider if you have not received your monitor within 1 week.   Helpful Tips for Accurate Home Blood Pressure Checks  . Don't smoke, exercise, or drink caffeine 30 minutes before checking your BP . Use the restroom before checking your BP (a full bladder can raise your pressure) . Relax in a comfortable upright chair . Feet on the ground . Left arm resting comfortably on a flat surface at the level of your heart . Legs uncrossed . Back supported . Sit quietly and don't talk . Place the cuff on your bare arm . Adjust snuggly, so  that only two fingertips can fit between your skin and the top of the cuff . Check 2 readings separated by at least one minute . Keep a log of your BP readings . For a visual, please reference this diagram: http://ccnc.care/bpdiagram  Provider Name: [facility]     Phone: [phone]  Zone 1: ALL CLEAR  Continue to monitor your symptoms:  . BP reading is less than 140 (top number) or less than 90 (bottom number)  . No right upper stomach pain . No headaches or seeing spots . No feeling nauseated or throwing up . No swelling in face and hands  Zone 2: CAUTION Call your doctor's office for any of the following:  . BP reading is greater than 140 (top number) or greater than 90 (bottom number)  . Stomach pain under your ribs in the middle or right side . Headaches or seeing spots . Feeling nauseated or throwing up . Swelling in face and hands  Zone 3: EMERGENCY  Seek immediate medical care if you have any of the following:  . BP reading is greater than160 (top number) or greater than 110 (bottom number) . Severe headaches not improving with Tylenol . Serious difficulty catching your breath . Any worsening symptoms from Zone 2     Second Trimester of Pregnancy The second trimester is from week 14 through week 27 (months 4 through 6). The second trimester is often a time when you feel your  best. Your body has adjusted to being pregnant, and you begin to feel better physically. Usually, morning sickness has lessened or quit completely, you may have more energy, and you may have an increase in appetite. The second trimester is also a time when the fetus is growing rapidly. At the end of the sixth month, the fetus is about 9 inches long and weighs about 1 pounds. You will likely begin to feel the baby move (quickening) between 16 and 20 weeks of pregnancy. Body changes during your second trimester Your body continues to go through many changes during your second trimester. The  changes vary from woman to woman.  Your weight will continue to increase. You will notice your lower abdomen bulging out.  You may begin to get stretch marks on your hips, abdomen, and breasts.  You may develop headaches that can be relieved by medicines. The medicines should be approved by your health care provider.  You may urinate more often because the fetus is pressing on your bladder.  You may develop or continue to have heartburn as a result of your pregnancy.  You may develop constipation because certain hormones are causing the muscles that push waste through your intestines to slow down.  You may develop hemorrhoids or swollen, bulging veins (varicose veins).  You may have back pain. This is caused by: ? Weight gain. ? Pregnancy hormones that are relaxing the joints in your pelvis. ? A shift in weight and the muscles that support your balance.  Your breasts will continue to grow and they will continue to become tender.  Your gums may bleed and may be sensitive to brushing and flossing.  Dark spots or blotches (chloasma, mask of pregnancy) may develop on your face. This will likely fade after the baby is born.  A dark line from your belly button to the pubic area (linea nigra) may appear. This will likely fade after the baby is born.  You may have changes in your hair. These can include thickening of your hair, rapid growth, and changes in texture. Some women also have hair loss during or after pregnancy, or hair that feels dry or thin. Your hair will most likely return to normal after your baby is born.  What to expect at prenatal visits During a routine prenatal visit:  You will be weighed to make sure you and the fetus are growing normally.  Your blood pressure will be taken.  Your abdomen will be measured to track your baby's growth.  The fetal heartbeat will be listened to.  Any test results from the previous visit will be discussed.  Your health care  provider may ask you:  How you are feeling.  If you are feeling the baby move.  If you have had any abnormal symptoms, such as leaking fluid, bleeding, severe headaches, or abdominal cramping.  If you are using any tobacco products, including cigarettes, chewing tobacco, and electronic cigarettes.  If you have any questions.  Other tests that may be performed during your second trimester include:  Blood tests that check for: ? Low iron levels (anemia). ? High blood sugar that affects pregnant women (gestational diabetes) between 71 and 28 weeks. ? Rh antibodies. This is to check for a protein on red blood cells (Rh factor).  Urine tests to check for infections, diabetes, or protein in the urine.  An ultrasound to confirm the proper growth and development of the baby.  An amniocentesis to check for possible genetic problems.  Fetal  screens for spina bifida and Down syndrome.  HIV (human immunodeficiency virus) testing. Routine prenatal testing includes screening for HIV, unless you choose not to have this test.  Follow these instructions at home: Medicines  Follow your health care provider's instructions regarding medicine use. Specific medicines may be either safe or unsafe to take during pregnancy.  Take a prenatal vitamin that contains at least 600 micrograms (mcg) of folic acid.  If you develop constipation, try taking a stool softener if your health care provider approves. Eating and drinking  Eat a balanced diet that includes fresh fruits and vegetables, whole grains, good sources of protein such as meat, eggs, or tofu, and low-fat dairy. Your health care provider will help you determine the amount of weight gain that is right for you.  Avoid raw meat and uncooked cheese. These carry germs that can cause birth defects in the baby.  If you have low calcium intake from food, talk to your health care provider about whether you should take a daily calcium  supplement.  Limit foods that are high in fat and processed sugars, such as fried and sweet foods.  To prevent constipation: ? Drink enough fluid to keep your urine clear or pale yellow. ? Eat foods that are high in fiber, such as fresh fruits and vegetables, whole grains, and beans. Activity  Exercise only as directed by your health care provider. Most women can continue their usual exercise routine during pregnancy. Try to exercise for 30 minutes at least 5 days a week. Stop exercising if you experience uterine contractions.  Avoid heavy lifting, wear low heel shoes, and practice good posture.  A sexual relationship may be continued unless your health care provider directs you otherwise. Relieving pain and discomfort  Wear a good support bra to prevent discomfort from breast tenderness.  Take warm sitz baths to soothe any pain or discomfort caused by hemorrhoids. Use hemorrhoid cream if your health care provider approves.  Rest with your legs elevated if you have leg cramps or low back pain.  If you develop varicose veins, wear support hose. Elevate your feet for 15 minutes, 3-4 times a day. Limit salt in your diet. Prenatal Care  Write down your questions. Take them to your prenatal visits.  Keep all your prenatal visits as told by your health care provider. This is important. Safety  Wear your seat belt at all times when driving.  Make a list of emergency phone numbers, including numbers for family, friends, the hospital, and police and fire departments. General instructions  Ask your health care provider for a referral to a local prenatal education class. Begin classes no later than the beginning of month 6 of your pregnancy.  Ask for help if you have counseling or nutritional needs during pregnancy. Your health care provider can offer advice or refer you to specialists for help with various needs.  Do not use hot tubs, steam rooms, or saunas.  Do not douche or use  tampons or scented sanitary pads.  Do not cross your legs for long periods of time.  Avoid cat litter boxes and soil used by cats. These carry germs that can cause birth defects in the baby and possibly loss of the fetus by miscarriage or stillbirth.  Avoid all smoking, herbs, alcohol, and unprescribed drugs. Chemicals in these products can affect the formation and growth of the baby.  Do not use any products that contain nicotine or tobacco, such as cigarettes and e-cigarettes. If you need help  quitting, ask your health care provider.  Visit your dentist if you have not gone yet during your pregnancy. Use a soft toothbrush to brush your teeth and be gentle when you floss. Contact a health care provider if:  You have dizziness.  You have mild pelvic cramps, pelvic pressure, or nagging pain in the abdominal area.  You have persistent nausea, vomiting, or diarrhea.  You have a bad smelling vaginal discharge.  You have pain when you urinate. Get help right away if:  You have a fever.  You are leaking fluid from your vagina.  You have spotting or bleeding from your vagina.  You have severe abdominal cramping or pain.  You have rapid weight gain or weight loss.  You have shortness of breath with chest pain.  You notice sudden or extreme swelling of your face, hands, ankles, feet, or legs.  You have not felt your baby move in over an hour.  You have severe headaches that do not go away when you take medicine.  You have vision changes. Summary  The second trimester is from week 14 through week 27 (months 4 through 6). It is also a time when the fetus is growing rapidly.  Your body goes through many changes during pregnancy. The changes vary from woman to woman.  Avoid all smoking, herbs, alcohol, and unprescribed drugs. These chemicals affect the formation and growth your baby.  Do not use any tobacco products, such as cigarettes, chewing tobacco, and e-cigarettes. If you  need help quitting, ask your health care provider.  Contact your health care provider if you have any questions. Keep all prenatal visits as told by your health care provider. This is important. This information is not intended to replace advice given to you by your health care provider. Make sure you discuss any questions you have with your health care provider. Document Released: 10/11/2001 Document Revised: 03/24/2016 Document Reviewed: 12/18/2012 Elsevier Interactive Patient Education  2017 Elsevier Inc.   

## 2021-02-08 NOTE — Progress Notes (Signed)
[place] 20+4 wks,cephalic,cx 3.1 cm,anterior placenta gr 0,normal ovaries,svp of fluid 4.7 cm,fhr 144 bpm,efw 403 g 75%,anatomy complete,no obvious abnormalities

## 2021-02-08 NOTE — Progress Notes (Signed)
   LOW-RISK PREGNANCY VISIT Patient name: Caroline Good MRN [id number]  Date of birth: 1996/08/04 Chief Complaint:   Routine Prenatal Visit and Pregnancy Ultrasound  History of Present Illness:   Caroline Good is a 25 y.o. G40P0010 female at [redacted]w[redacted]d with an Estimated Date of Delivery: 06/24/21 being seen today for ongoing management of a low-risk pregnancy.  Depression screen [facility] 2/9 12/10/2020 03/31/2020 08/17/2017  Decreased Interest 1 0 0  Down, Depressed, Hopeless 0 0 0  PHQ - 2 Score 1 0 0  Altered sleeping 2 - -  Tired, decreased energy 1 - -  Change in appetite 0 - -  Feeling bad or failure about yourself  0 - -  Trouble concentrating 0 - -  Moving slowly or fidgety/restless 0 - -  Suicidal thoughts 0 - -  PHQ-9 Score 4 - -    Today she reports no complaints. Contractions: Not present. Vag. Bleeding: None.  Movement: Present. denies leaking of fluid. Review of Systems:   Pertinent items are noted in HPI Denies abnormal vaginal discharge w/ itching/odor/irritation, headaches, visual changes, shortness of breath, chest pain, abdominal pain, severe nausea/vomiting, or problems with urination or bowel movements unless otherwise stated above. Pertinent History Reviewed:  Reviewed past medical,surgical, social, obstetrical and family history.  Reviewed problem list, medications and allergies. Physical Assessment:   Vitals:   02/08/21 1145  BP: (!) 99/56  Pulse: 69  Weight: 130 lb (59 kg)  Body mass index is 22.31 kg/m.        Physical Examination:   General appearance: Well appearing, and in no distress  Mental status: Alert, oriented to person, place, and time  Skin: Warm & dry  Cardiovascular: Normal heart rate noted  Respiratory: Normal respiratory effort, no distress  Abdomen: Soft, gravid, nontender  Pelvic: Cervical exam deferred         Extremities: Edema: None  Fetal Status: Fetal Heart Rate (bpm): 144 u/s   Movement: Present     [place] 20+4 wks,cephalic,cx 3.1  cm,anterior placenta gr 0,normal ovaries,svp of fluid 4.7 cm,fhr 144 bpm,efw 403 g 75%,anatomy complete,no obvious abnormalities   Chaperone: N/A   No results found for this or any previous visit (from the past 24 hour(s)).  Assessment & Plan:  1) Low-risk pregnancy G2P0010 at [redacted]w[redacted]d with an Estimated Date of Delivery: 06/24/21    Meds: No orders of the defined types were placed in this encounter.  Labs/procedures today: U/S  Plan:  Continue routine obstetrical care  Next visit: prefers in person    Reviewed: Preterm labor symptoms and general obstetric precautions including but not limited to vaginal bleeding, contractions, leaking of fluid and fetal movement were reviewed in detail with the patient.  All questions were answered. Does have home bp cuff. Office bp cuff given: not applicable. Check bp weekly, let us know if consistently >140 and/or >90.  Follow-up: Return in about 4 weeks (around 03/08/2021) for LROB, CNM, in person.  No future appointments.  No orders of the defined types were placed in this encounter.  Cheral Marker CNM, [facility] [place] 02/08/2021 12:07 PM

## 2021-08-13 IMAGING — US US OB TRANSVAGINAL
1 series · 15 of 28 positions shown · non-contrast
Comparison: Pelvic ultrasound dated 12/09/2019.

CLINICAL DATA: 23-year-old pregnant female with pelvic cramping and
vaginal bleeding. LMP: 09/28/2019 corresponding to an estimated
gestational age of 12 weeks, 3 days.

EXAM:
TRANSVAGINAL OB ULTRASOUND
TECHNIQUE: Transvaginal ultrasound was performed for complete evaluation of the
gestation as well as the maternal uterus, adnexal regions, and
pelvic cul-de-sac.

[Series 1: us ob transvaginal · 37 acquisitions, 15 frames shown]
[im 1/37]
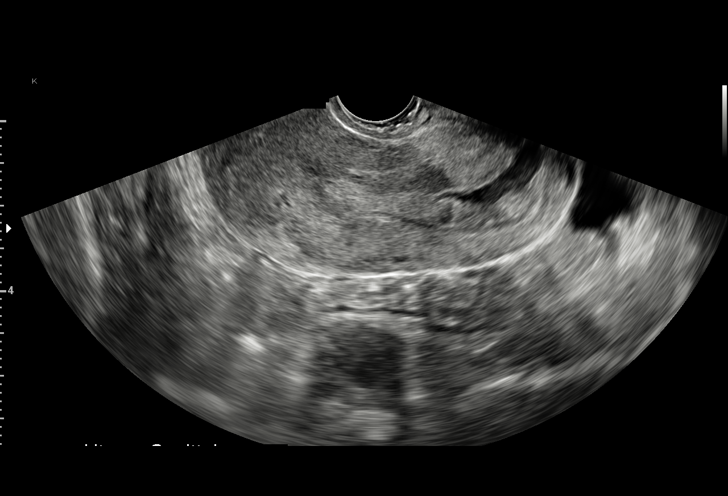
[im 3/37]
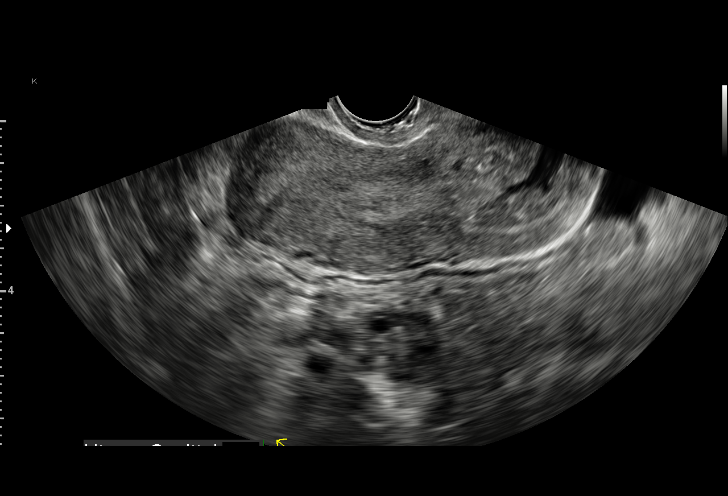
[im 6/37]
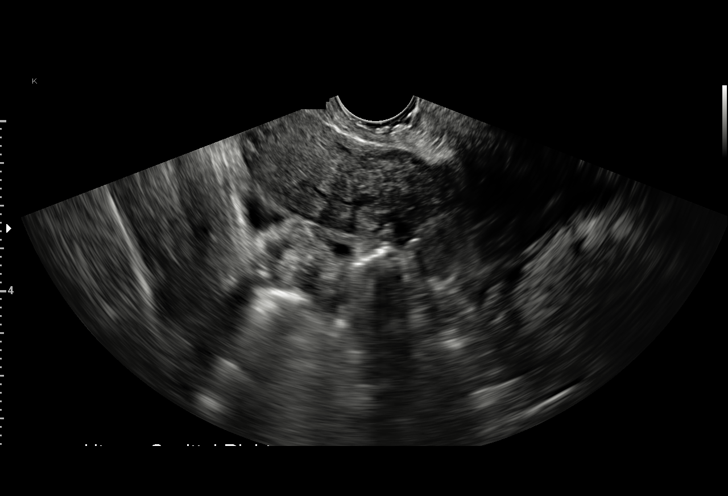
[im 9/37]
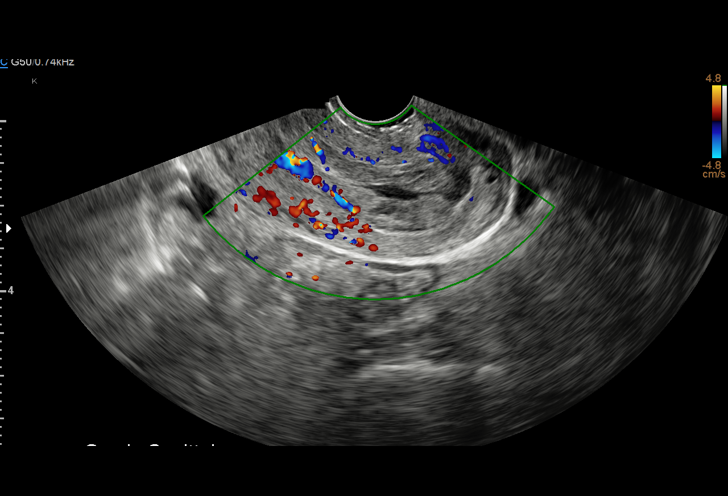
[im 11/37]
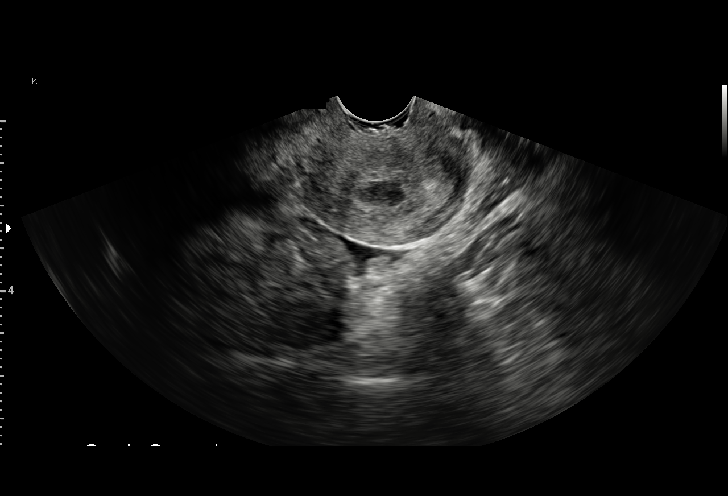
[im 14/37]
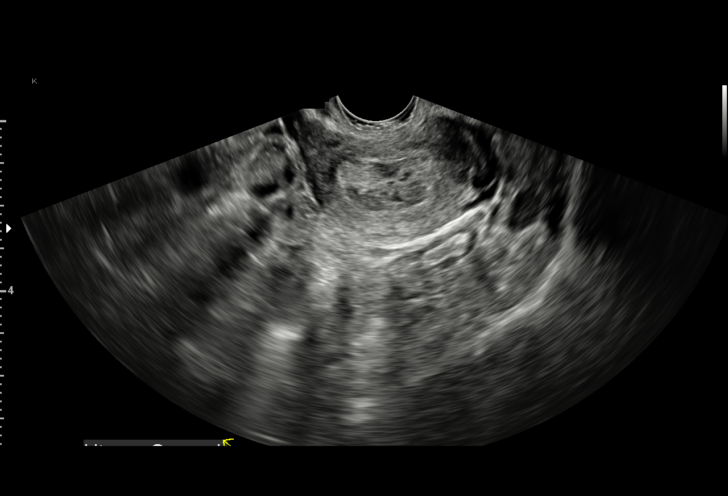
[im 17/37]
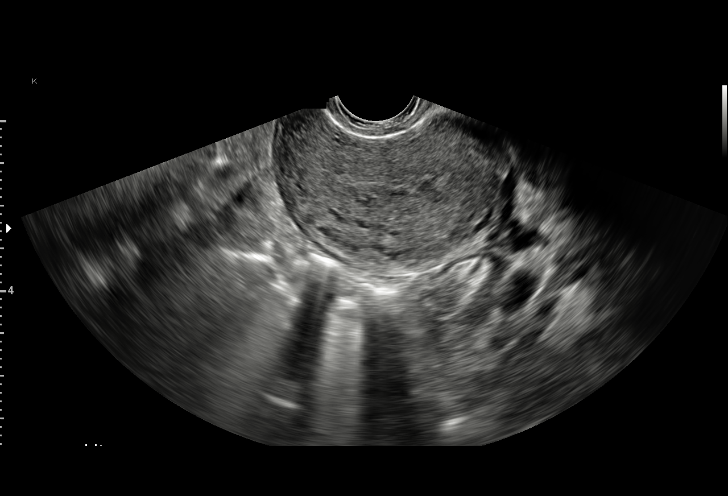
[im 19/37]
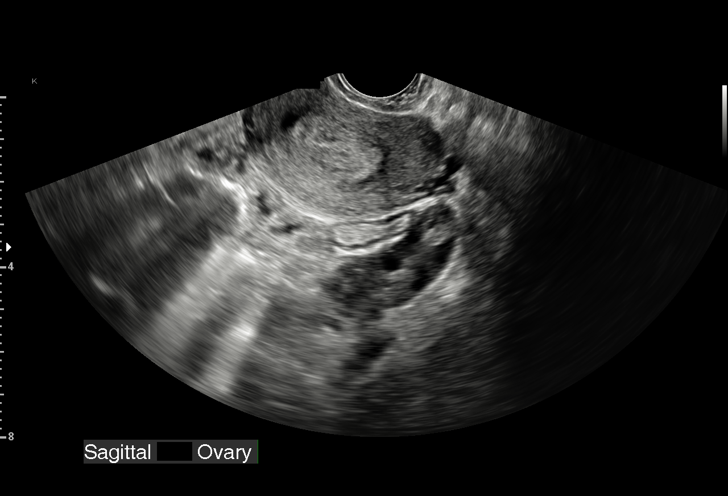
[im 21/37]
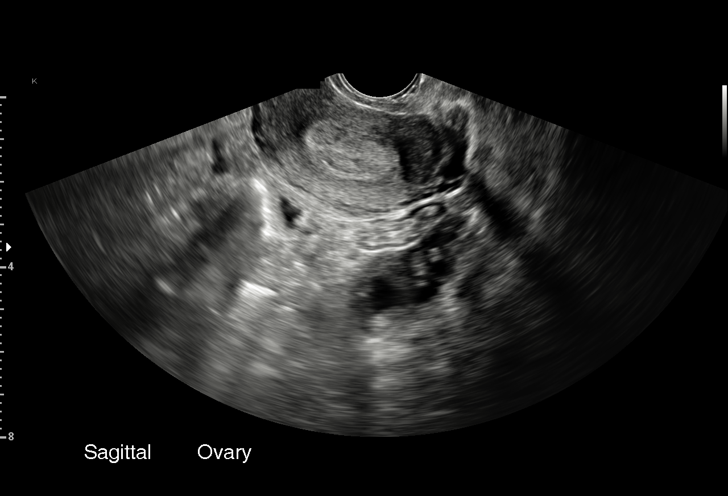
[im 23/37]
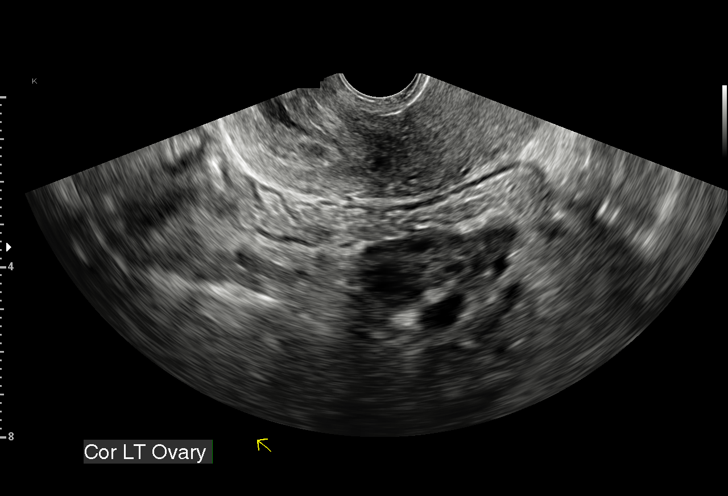
[im 26/37]
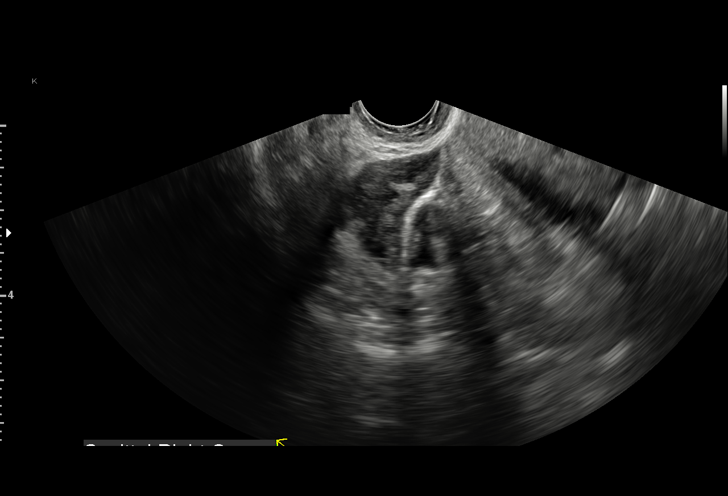
[im 29/37]
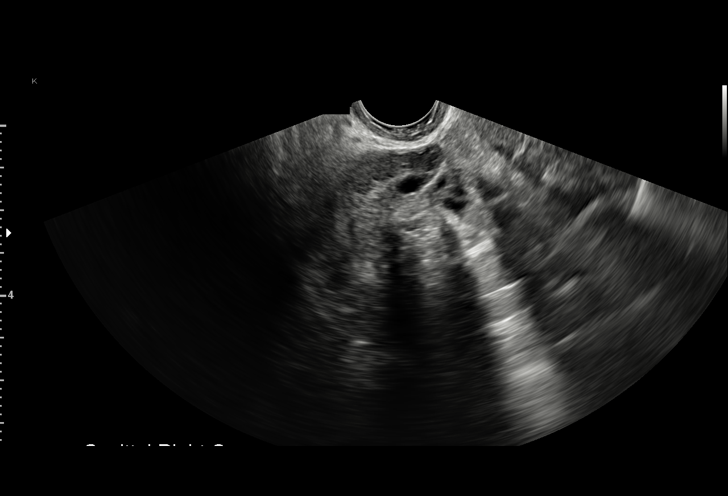
[im 31/37]
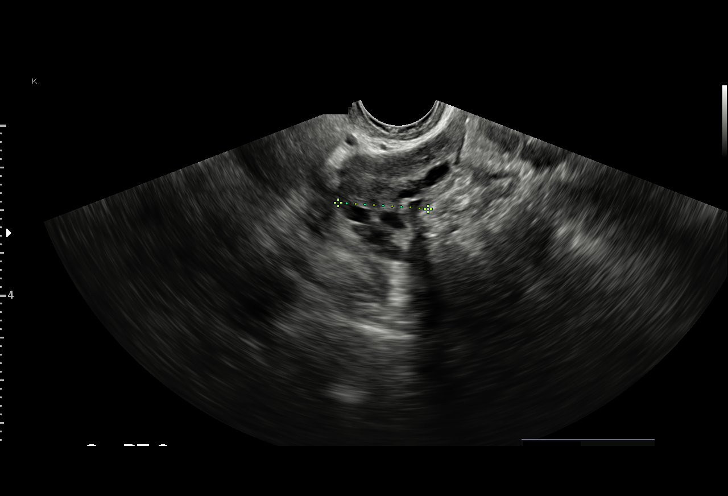
[im 34/37]
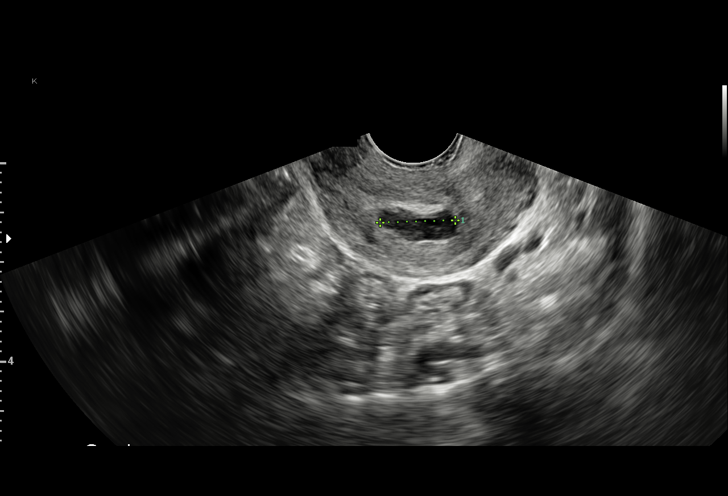
[im 37/37]
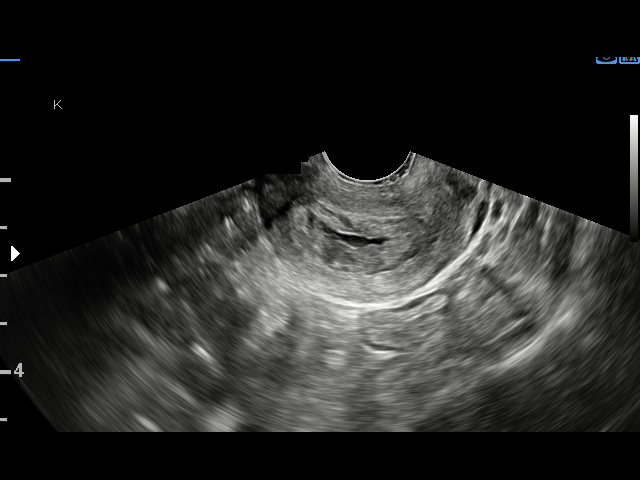

[15 of 28 positions shown; findings below may reference images not displayed]

FINDINGS: There is a 2.5 x 0.6 x 1.5 cm complex tissue with solid and cystic
component in the endocervical region which may represent blood clot
or correspond to the cystic/sac-like structure seen on the prior
ultrasound. The overall appearance most concerning for miscarriage
in progress. Retained products of conception is not excluded.
Correlation with clinical exam and serial HCG levels and follow-up
with ultrasound as clinically indicated.

The ovaries are unremarkable.

Small free fluid within the pelvis.
IMPRESSION: Findings most consistent with miscarriage in progress. Retained
product of conception is not entirely excluded. Clinical correlation
and follow-up recommended.

## 2021-08-20 ENCOUNTER — Emergency Department
Admission: EM | Admit: 2021-08-20 | Discharge: 2021-08-20 | Disposition: A | Discharge status: Home / Self Care | Payer: 59 | Attending: Emergency Medicine | Admitting: Emergency Medicine

## 2021-08-20 ENCOUNTER — Other Ambulatory Visit: Payer: Self-pay

## 2021-08-20 ENCOUNTER — Emergency Department: Payer: 59

## 2021-08-20 DIAGNOSIS — S161XXA Strain of muscle, fascia and tendon at neck level, initial encounter: Secondary | ICD-10-CM | POA: Diagnosis not present

## 2021-08-20 DIAGNOSIS — Z87891 Personal history of nicotine dependence: Secondary | ICD-10-CM | POA: Diagnosis not present

## 2021-08-20 DIAGNOSIS — Y92410 Unspecified street and highway as the place of occurrence of the external cause: Secondary | ICD-10-CM | POA: Insufficient documentation

## 2021-08-20 DIAGNOSIS — R519 Headache, unspecified: Secondary | ICD-10-CM | POA: Insufficient documentation

## 2021-08-20 DIAGNOSIS — S199XXA Unspecified injury of neck, initial encounter: Secondary | ICD-10-CM | POA: Diagnosis present

## 2021-08-20 DIAGNOSIS — G44319 Acute post-traumatic headache, not intractable: Secondary | ICD-10-CM

## 2021-08-20 MED ORDER — NAPROXEN 500 MG PO TABS: 500.0000 mg | ORAL_TABLET | Freq: Two times a day (BID) | ORAL | 0 refills | Status: DC

## 2021-08-20 MED ORDER — BUTALBITAL-APAP-CAFFEINE 50-325-40 MG PO TABS
1.0000 | ORAL_TABLET | Freq: Once | ORAL | Status: AC
  Administered 2021-08-20: 1 via ORAL
  Filled 2021-08-20: qty 1

## 2021-08-20 MED ORDER — METHOCARBAMOL 500 MG PO TABS: 500.0000 mg | ORAL_TABLET | Freq: Three times a day (TID) | ORAL | 0 refills | Status: DC | PRN

## 2021-08-20 NOTE — Discharge Instructions (Signed)
Follow up with primary care if not improving over the week.  Return to the ER for symptoms that change, worsen, or for new concerns.
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
## 2021-11-22 ENCOUNTER — Other Ambulatory Visit: Payer: Self-pay

## 2021-11-22 ENCOUNTER — Emergency Department: Payer: 59

## 2021-11-22 ENCOUNTER — Emergency Department
Admission: EM | Admit: 2021-11-22 | Discharge: 2021-11-22 | Disposition: A | Discharge status: Home / Self Care | Payer: 59 | Attending: Emergency Medicine | Admitting: Emergency Medicine

## 2021-11-22 ENCOUNTER — Encounter: Payer: Self-pay | Admitting: Emergency Medicine

## 2021-11-22 DIAGNOSIS — O209 Hemorrhage in early pregnancy, unspecified: Secondary | ICD-10-CM | POA: Diagnosis present

## 2021-11-22 DIAGNOSIS — Z3A01 Less than 8 weeks gestation of pregnancy: Secondary | ICD-10-CM | POA: Insufficient documentation

## 2021-11-22 DIAGNOSIS — N939 Abnormal uterine and vaginal bleeding, unspecified: Secondary | ICD-10-CM

## 2021-11-22 DIAGNOSIS — O4690 Antepartum hemorrhage, unspecified, unspecified trimester: Secondary | ICD-10-CM

## 2021-11-22 LAB — POC URINE PREG, ED: Preg Test, Ur: POSITIVE — AB

## 2021-11-22 LAB — BASIC METABOLIC PANEL
Anion gap: 8 (ref 5–15)
BUN: 11 mg/dL (ref 6–20)
CO2: 25 mmol/L (ref 22–32)
Calcium: 9.3 mg/dL (ref 8.9–10.3)
Chloride: 104 mmol/L (ref 98–111)
Creatinine, Ser: 0.46 mg/dL (ref 0.44–1.00)
GFR, Estimated: >60 mL/min (ref >60)
Glucose, Bld: 95 mg/dL (ref 70–99)
Potassium: 3.4 mmol/L — ABNORMAL LOW (ref 3.5–5.1)
Sodium: 137 mmol/L (ref 135–145)

## 2021-11-22 LAB — URINALYSIS, ROUTINE W REFLEX MICROSCOPIC
Bilirubin Urine: NEGATIVE
Glucose, UA: NEGATIVE mg/dL
Ketones, ur: NEGATIVE mg/dL
Nitrite: NEGATIVE
Protein, ur: NEGATIVE mg/dL
RBC / HPF: >50 RBC/hpf — ABNORMAL HIGH (ref 0–5)
Specific Gravity, Urine: 1.015 (ref 1.005–1.030)
pH: 7 (ref 5.0–8.0)

## 2021-11-22 LAB — CBC WITH DIFFERENTIAL/PLATELET
Abs Immature Granulocytes: 0.03 10*3/uL (ref 0.00–0.07)
Basophils Absolute: 0 10*3/uL (ref 0.0–0.1)
Basophils Relative: 0 %
Eosinophils Absolute: 0.2 10*3/uL (ref 0.0–0.5)
Eosinophils Relative: 2 %
HCT: 35.3 % — ABNORMAL LOW (ref 36.0–46.0)
Hemoglobin: 11.7 g/dL — ABNORMAL LOW (ref 12.0–15.0)
Immature Granulocytes: 0 %
Lymphocytes Relative: 23 %
Lymphs Abs: 1.9 10*3/uL (ref 0.7–4.0)
MCH: 29 pg (ref 26.0–34.0)
MCHC: 33.1 g/dL (ref 30.0–36.0)
MCV: 87.6 fL (ref 80.0–100.0)
Monocytes Absolute: 0.7 10*3/uL (ref 0.1–1.0)
Monocytes Relative: 9 %
Neutro Abs: 5.2 10*3/uL (ref 1.7–7.7)
Neutrophils Relative %: 66 %
Platelets: 263 10*3/uL (ref 150–400)
RBC: 4.03 MIL/uL (ref 3.87–5.11)
RDW: 12.8 % (ref 11.5–15.5)
WBC: 8 10*3/uL (ref 4.0–10.5)
nRBC: 0 % (ref 0.0–0.2)

## 2021-11-22 LAB — HCG, QUANTITATIVE, PREGNANCY: hCG, Beta Chain, Quant, S: 10303 m[IU]/mL — ABNORMAL HIGH (ref <5)

## 2021-11-22 MED ORDER — CEPHALEXIN 500 MG PO CAPS: 500.0000 mg | ORAL_CAPSULE | Freq: Three times a day (TID) | ORAL | 0 refills | Status: AC

## 2021-11-22 NOTE — ED Notes (Signed)
See triage note  presents with some vaginal bleeding  states no clots but feels like it is more than spotting    states she is [redacted] weeks pregnant

## 2021-11-22 NOTE — ED Provider Notes (Signed)
Patient received in signout from Dr. Fuller Plan pending TVUS in the setting of vaginal bleeding in the first trimester.  No significant blood loss anemia and blood work is benign.  hCG slightly elevated and urine with asymptomatic bacteriuria and blood.  TVUS reviewed by me with unfortunately fairly low heart rate for the fetus of only 101 bpm, but IUP at this time.  I discussed with the patient my concerns for relatively low heart rate and in the setting of her vaginal bleeding but this may represent upcoming miscarriage.  We discussed heart rates in early trimester gestation. She has an appointment this coming Thursday with her [facility] and she would like to follow-up with them at that time.  I offered to reach out to one of her OB/GYN physicians on-call here in the hospital, but she declines. We discussed treatment of asymptomatic bacteriuria, following up closely with OB/GYN and return precautions for the ED.  Patient suitable for outpatient management.   Delton Prairie, MD 11/22/21 [phone]

## 2021-11-22 NOTE — ED Provider Triage Note (Signed)
Emergency Medicine Provider Triage Evaluation Note  FIORELLA HANAHAN, a 26 y.o. G2P0 female  was evaluated in triage.  Pt complains of vaginal bleeding. She notes scant bleeding, both bright red and dark, staining her panty liners. She denies cramping, N/V, or dysuria.   Review of Systems  Positive: Vaginal bleeding Negative: NV  Physical Exam  BP 116/69 (BP Location: Right Arm)    Pulse 87    Temp 98.8 F (37.1 C) (Oral)    Resp 16    Ht 5\' 4"  (1.626 m)    SpO2 100%    BMI 22.31 kg/m  Gen:   Awake, no distress   Resp:  Normal effort  MSK:   Moves extremities without difficulty  Other:  CVS: RRR  Medical Decision Making  Medically screening exam initiated at 2:18 PM.  Appropriate orders placed.  was informed that the remainder of the evaluation will be completed by another provider, this initial triage assessment does not replace that evaluation, and the importance of remaining in the ED until their evaluation is complete.  Female G2P0 patient at [redacted] weeks GA, presenting with vaginal bleeding.    Park Breed, PA-C 11/22/21 1420

## 2021-11-22 NOTE — ED Triage Notes (Signed)
Pt to ED via POV with c/o vaginal bleeding that began today, has just had to use panty liners because it has not been heavy. She is [redacted] weeks pregnant

## 2021-11-22 NOTE — Discharge Instructions (Addendum)
As we discussed, unfortunately the heart rate of the fetus is fairly slow at only 100 bpm, which should be quite a bit higher such as 120-170.  Alongside your vaginal bleeding, my concern is that this may inevitably miscarry.   Please follow-up closely with your OB/GYN at [facility], and make that appointment on Thursday.  If you develop any significantly worsening bleeding with passage of clots, severe pain or fevers, please return to the ED.  Because of the small amount of bacteria in your urine, you are being discharged with a couple days of antibiotics to prevent infection.

## 2021-11-22 NOTE — ED Provider Notes (Signed)
[facility] Provider Note    Event Date/Time   First MD Initiated Contact with Patient 11/22/21 1438     (approximate)   History   Vaginal Bleeding   HPI  Caroline Good is a 26 y.o. female who is currently in her third pregnancy who comes in with concerns for vaginal bleeding.  Patient reports having a miscarriage has her first pregnancy and her second pregnancy was 72 months old.  She reports that she started having a little bit of vaginal spotting starting today.  She is wearing pantiliners only.  Reports a little bit of cramping but no significant pain to 1 side.  Patient reports that she is O+ and I did confirm a blood type from 06/10/2021 where patient was a positive.  She denies any discharge concerns for STDs or any other symptoms.      Physical Exam   Triage Vital Signs: ED Triage Vitals  Enc Vitals Group     BP 11/22/21 1416 116/69     Pulse Rate 11/22/21 1416 87     Resp 11/22/21 1416 16     Temp 11/22/21 1416 98.8 F (37.1 C)     Temp Source 11/22/21 1416 Oral     SpO2 11/22/21 1416 100 %     Weight 11/22/21 1429 130 lb 1.1 oz (59 kg)     Height 11/22/21 1417 5\' 4"  (1.626 m)     Head Circumference --      Peak Flow --      Pain Score 11/22/21 1416 3     Pain Loc --      Pain Edu? --      Excl. in GC? --     Most recent vital signs: Vitals:   11/22/21 1416  BP: 116/69  Pulse: 87  Resp: 16  Temp: 98.8 F (37.1 C)  SpO2: 100%     General: Awake, no distress.  Well-appearing CV:  Good peripheral perfusion.  Resp:  Normal effort.  Abd:  No distention.  Soft and nontender.    ED Results / Procedures / Treatments   Labs (all labs ordered are listed, but only abnormal results are displayed) Labs Reviewed  CBC WITH DIFFERENTIAL/PLATELET  BASIC METABOLIC PANEL  HCG, QUANTITATIVE, PREGNANCY  POC URINE PREG, ED      RADIOLOGY Ultrasound pending   PROCEDURES:  Critical Care performed:  No  Procedures   MEDICATIONS ORDERED IN ED: Medications - No data to display   IMPRESSION / MDM / ASSESSMENT AND PLAN / ED COURSE  I reviewed the triage vital signs and the nursing notes.                              Differential diagnosis includes, but is not limited to, miscarriage, ectopic pregnancy, threatened miscarriage.  Offered patient pelvic exam given the minimal amount of bleeding no concerns for STD she declined.  Labs ordered evaluate for any anemia, UTI.  Patient does not need RhoGAM given O+  Patient be handed off to oncoming team pending these results       FINAL CLINICAL IMPRESSION(S) / ED DIAGNOSES   Final diagnoses:  Vaginal bleeding in pregnancy     Rx / DC Orders   ED Discharge Orders     None        Note:  This document was prepared using Dragon voice recognition software and may include unintentional dictation errors.  Concha Se, MD 11/22/21 [phone]

## 2021-11-23 LAB — URINE CULTURE: Culture: NO GROWTH

## 2021-11-24 ENCOUNTER — Other Ambulatory Visit: Payer: Self-pay

## 2021-11-24 ENCOUNTER — Encounter (HOSPITAL_COMMUNITY): Payer: Self-pay | Admitting: Obstetrics & Gynecology

## 2021-11-24 ENCOUNTER — Inpatient Hospital Stay (HOSPITAL_COMMUNITY)
Admission: AD | Admit: 2021-11-24 | Discharge: 2021-11-25 | Disposition: A | Discharge status: Home / Self Care | Payer: 59 | Attending: Obstetrics & Gynecology | Admitting: Obstetrics & Gynecology

## 2021-11-24 ENCOUNTER — Inpatient Hospital Stay (HOSPITAL_COMMUNITY): Payer: 59

## 2021-11-24 DIAGNOSIS — O209 Hemorrhage in early pregnancy, unspecified: Secondary | ICD-10-CM | POA: Diagnosis not present

## 2021-11-24 DIAGNOSIS — O039 Complete or unspecified spontaneous abortion without complication: Secondary | ICD-10-CM | POA: Diagnosis not present

## 2021-11-24 DIAGNOSIS — Z87891 Personal history of nicotine dependence: Secondary | ICD-10-CM | POA: Insufficient documentation

## 2021-11-24 DIAGNOSIS — O200 Threatened abortion: Secondary | ICD-10-CM

## 2021-11-24 MED ORDER — IBUPROFEN 600 MG PO TABS
600.0000 mg | ORAL_TABLET | Freq: Once | ORAL | Status: AC
  Administered 2021-11-24: 600 mg via ORAL
  Filled 2021-11-24: qty 1

## 2021-11-24 NOTE — MAU Provider Note (Signed)
Chief Complaint: Vaginal Bleeding   Event Date/Time   First Provider Initiated Contact with Patient 11/24/21 2311        SUBJECTIVE HPI: Caroline Good is a 26 y.o. G3P0010 at [redacted]w[redacted]d by LMP who presents to maternity admissions reporting heavy vaginal bleeding today, soaking through 3 pads in 2 hours.  Also having pelvic cramping.  Seen in [place] ED yesterday with susptected threatened abortion. . She denies urinary symptoms, h/a, dizziness, n/v, or fever/chills.   Has an appointment at [facility] tomorrow for an ultrasound with her primary OB/GYN provider.   Vaginal Bleeding The patient's primary symptoms include pelvic pain and vaginal bleeding. The patient's pertinent negatives include no genital itching, genital lesions or genital odor. This is a new problem. The current episode started today. The problem has been gradually improving. She is pregnant. Associated symptoms include abdominal pain. Pertinent negatives include no chills, fever, nausea or vomiting. The vaginal discharge was bloody. The vaginal bleeding is heavier than menses. She has been passing clots. She has been passing tissue. Nothing aggravates the symptoms. She has tried nothing for the symptoms.   RN Note: Pt reports to MAU c/o bleeding that started Monday morning. I went to [facility] Monday they said the baby's heart rate was too low and I was probably going to miscarry; if it got worse come back to the hospital. I've used 3 pads in the past 2 hours. Reports cramping and rates it 6/10.  Past Medical History:  Diagnosis Date   Anxiety    Miscarriage    Past Surgical History:  Procedure Laterality Date   TONSILLECTOMY     Social History   Socioeconomic History   Marital status: Married    Spouse name: Not on file   Number of children: Not on file   Years of education: Not on file   Highest education level: Not on file  Occupational History   Not on file  Tobacco Use   Smoking status: Former     Types: E-cigarettes   Smokeless tobacco: Never   Tobacco comments:    denies  Vaping Use   Vaping Use: Former   Devices: denies  Substance and Sexual Activity   Alcohol use: No   Drug use: No   Sexual activity: Yes    Birth control/protection: None  Other Topics Concern   Not on file  Social History Narrative   Not on file   Social Determinants of Health   Financial Resource Strain: Low Risk    Difficulty of Paying Living Expenses: Not hard at all  Food Insecurity: No Food Insecurity   Worried About Programme researcher, broadcasting/film/video in the Last Year: Never true   Ran Out of Food in the Last Year: Never true  Transportation Needs: No Transportation Needs   Lack of Transportation (Medical): No   Lack of Transportation (Non-Medical): No  Physical Activity: Sufficiently Active   Days of Exercise per Week: 6 days   Minutes of Exercise per Session: 30 min  Stress: Stress Concern Present   Feeling of Stress : To some extent  Social Connections: Moderately Integrated   Frequency of Communication with Friends and Family: More than three times a week   Frequency of Social Gatherings with Friends and Family: Never   Attends Religious Services: More than 4 times per year   Active Member of [employer] or Organizations: No   Attends Banker Meetings: Never   Marital Status: Married  Catering manager Violence: Not At  Risk   Fear of Current or Ex-Partner: No   Emotionally Abused: No   Physically Abused: No   Sexually Abused: No   No current facility-administered medications on file prior to encounter.   Current Outpatient Medications on File Prior to Encounter  Medication Sig Dispense Refill   Prenatal Vit-Fe Fumarate-FA (PRENATAL VITAMINS) 28-0.8 MG TABS Take 1 daily 30 tablet    acetaminophen (TYLENOL) 500 MG tablet Take 500 mg by mouth every 6 (six) hours as needed for mild pain.     cephALEXin (KEFLEX) 500 MG capsule Take 1 capsule (500 mg total) by mouth 3 (three) times daily for 3  days. 9 capsule 0   No Known Allergies  I have reviewed patient's Past Medical Hx, Surgical Hx, Family Hx, Social Hx, medications and allergies.   ROS:  Review of Systems  Constitutional:  Negative for chills and fever.  Gastrointestinal:  Positive for abdominal pain. Negative for nausea and vomiting.  Genitourinary:  Positive for pelvic pain and vaginal bleeding.  Review of Systems  Other systems negative   Physical Exam  Physical Exam Patient Vitals for the past 24 hrs:  BP Temp Temp src Pulse Resp SpO2 Height Weight  11/24/21 2236 (!) 108/58 98.2 F (36.8 C) Oral 87 18 99 % 5\' 4"  (1.626 m) 56.6 kg   Constitutional: Well-developed, well-nourished female in no acute distress.  Cardiovascular: normal rate Respiratory: normal effort GI: Abd soft, non-tender.  MS: Extremities nontender, no edema, normal ROM Neurologic: Alert and oriented x 4.  GU: Neg CVAT.  PELVIC EXAM: Deferred in lieu of ultrasound examination                           Vaginal bleeding is light to moderate currently   LAB RESULTS Results for orders placed or performed during the hospital encounter of 11/24/21 (from the past 24 hour(s))  CBC     Status: Abnormal   Collection Time: 11/24/21 11:56 PM  Result Value Ref Range   WBC 9.6 4.0 - 10.5 K/uL   RBC 3.98 3.87 - 5.11 MIL/uL   Hemoglobin 11.6 (L) 12.0 - 15.0 g/dL   HCT 16.134.9 (L) 09.636.0 - 04.546.0 %   MCV 87.7 80.0 - 100.0 fL   MCH 29.1 26.0 - 34.0 pg   MCHC 33.2 30.0 - 36.0 g/dL   RDW 40.912.8 81.111.5 - 91.415.5 %   Platelets 256 150 - 400 K/uL   nRBC 0.0 0.0 - 0.2 %    Latest Reference Range & Units 11/22/21 14:19  Hemoglobin 12.0 - 15.0 g/dL 78.211.7 (L)  (L): Data is abnormally low  O/Positive/-- (02/10 1516)  IMAGING US OB Transvaginal  Result Date: 11/24/2021 CLINICAL DATA:  Vaginal bleeding. EXAM: OBSTETRIC <14 WK ULTRASOUND TECHNIQUE: Transvaginal ultrasound was performed for evaluation of the gestation as well as the maternal uterus and adnexal regions.  COMPARISON:  Obstetrical ultrasound 11/22/2021. FINDINGS: Intrauterine gestational sac: None Yolk sac:  Not Visualized. Embryo:  Not Visualized. Cardiac Activity: Not Visualized. Endometrium: Heterogeneous endometrium measuring 2 cm in thickness. No vascularity identified within the endometrium. Maternal uterus/adnexae: Bilateral ovaries appear within normal limits. There is no pelvic free fluid. IMPRESSION: 1. No intrauterine gestational sac identified. Heterogeneous thickened endometrium without vascularity. Given the clinical history, findings are completed abortion. Electronically Signed   By: Darliss CheneyAmy  Guttmann M.D.   On: 11/24/2021 23:21   US OB LESS THAN 14 WEEKS WITH OB TRANSVAGINAL  Result Date: 11/22/2021 CLINICAL DATA:  Vaginal bleeding EXAM: OBSTETRIC <14 WK [place] AND TRANSVAGINAL OB US TECHNIQUE: Both transabdominal and transvaginal ultrasound examinations were performed for complete evaluation of the gestation as well as the maternal uterus, adnexal regions, and pelvic cul-de-sac. Transvaginal technique was performed to assess early pregnancy. COMPARISON:  None. FINDINGS: Intrauterine gestational sac: Single Yolk sac:  Visualized. Embryo:  Visualized. Cardiac Activity: Visualized. Heart Rate: 101 bpm CRL:  2.1 mm   5 w   5 d                  [place] EDC: 07/20/2022 Subchorionic hemorrhage:  None visualized. Maternal uterus/adnexae: Retroverted uterus. Bilateral ovaries within normal limits. No free fluid within the pelvis. IMPRESSION: 1. Single live intrauterine gestation measuring 5 weeks 5 days by crown-rump length. 2. Active embryonic heart tones at 101 BPM. Electronically Signed   By: Duanne Guess D.O.   On: 11/22/2021 17:02    MAU Management/MDM: Ordered followup Ultrasound to rule out SAB.  This did show a completed SAB.  No evidence of retained tissue.   This bleeding/pain can represent a normal pregnancy with bleeding, spontaneous abortion which can be serious.  The process as listed above  helps to determine which of these is present.  Discussed findings with patient and her husband.  She had a very hard time with her last miscarriage with hemorrhage and prolonged course of completion.  So she was surprised this one is complete. We did check her hemoglobin to assess for blood loss, and change in Hemoglobin was minimal   ASSESSMENT 1. Bleeding in early pregnancy   2. Threatened abortion affecting intrauterine pregnancy   3.    Completed spontaneous abortion  PLAN Discharge home Recommend she followup with her OB provider in [place] .  Pt stable at time of discharge. Encouraged to return here if she develops worsening of symptoms, increase in pain, fever, or other concerning symptoms.    Wynelle Bourgeois CNM, MSN Certified Nurse-Midwife 11/24/2021  11:49 PM

## 2021-11-24 NOTE — MAU Note (Signed)
Pt reports to MAU c/o bleeding that started Monday morning. I went to [facility] Monday they said the baby's heart rate was too low and I was probably going to miscarry; if it got worse come back to the hospital. I've used 3 pads in the past 2 hours. Reports cramping and rates it 6/10.

## 2021-11-25 DIAGNOSIS — O209 Hemorrhage in early pregnancy, unspecified: Secondary | ICD-10-CM

## 2021-11-25 DIAGNOSIS — O039 Complete or unspecified spontaneous abortion without complication: Secondary | ICD-10-CM

## 2021-11-25 LAB — CBC
HCT: 34.9 % — ABNORMAL LOW (ref 36.0–46.0)
Hemoglobin: 11.6 g/dL — ABNORMAL LOW (ref 12.0–15.0)
MCH: 29.1 pg (ref 26.0–34.0)
MCHC: 33.2 g/dL (ref 30.0–36.0)
MCV: 87.7 fL (ref 80.0–100.0)
Platelets: 256 10*3/uL (ref 150–400)
RBC: 3.98 MIL/uL (ref 3.87–5.11)
RDW: 12.8 % (ref 11.5–15.5)
WBC: 9.6 10*3/uL (ref 4.0–10.5)
nRBC: 0 % (ref 0.0–0.2)

## 2023-04-16 NOTE — Patient Instructions (Addendum)
It was a pleasure meeting you today. Thank you for allowing me to take part in your health care.  Our goals for today as we discussed include:  PAP due 2025  Continue multivitamins daily  Tetanus vaccine up to date  Recommend HPV vaccine.  Follow up in 1 year   Will review chart    If you have any questions or concerns, please do not hesitate to call the office at [phone].  I look forward to our next visit and until then take care and stay safe.  Regards,   Dana Allan, MD   [facility]

## 2023-04-16 NOTE — Progress Notes (Signed)
SUBJECTIVE:   Chief Complaint  Patient presents with   Establish Care   HPI Patient presents to clinic to establish care  No acute concerns today  No current medications   PERTINENT PMH / PSH: Panic Attacks at 22 yrs of age  Tonsillectomy  Spontaneous Abortions x 2 at 8 weeks  Maternal HTN Paternal HTN, MI at 40 yrs Maternal GF MI in 32's  Denies any Tobacco use Quit vaping 3 months ago Denies EtOH, THC/Cocaine, other recreational drugs  Last PAP 2023 due 2026   OBJECTIVE:  BP 114/66   Pulse 75   Temp 98.2 F (36.8 C)   Ht 5\' 4"  (1.626 m)   Wt 132 lb 9.6 oz (60.1 kg)   LMP 04/04/2023   SpO2 99%   BMI 22.76 kg/m    Physical Exam Vitals reviewed.  Constitutional:      General: She is not in acute distress.    Appearance: She is not ill-appearing.  HENT:     Head: Normocephalic.     Right Ear: Tympanic membrane, ear canal and external ear normal.     Left Ear: Tympanic membrane, ear canal and external ear normal.     Nose: Nose normal.     Mouth/Throat:     Mouth: Mucous membranes are moist.  Eyes:     Extraocular Movements: Extraocular movements intact.     Conjunctiva/sclera: Conjunctivae normal.     Pupils: Pupils are equal, round, and reactive to light.  Neck:     Thyroid: No thyromegaly or thyroid tenderness.     Vascular: No carotid bruit.  Cardiovascular:     Rate and Rhythm: Normal rate and regular rhythm.     Pulses: Normal pulses.     Heart sounds: Normal heart sounds.  Pulmonary:     Effort: Pulmonary effort is normal.     Breath sounds: Normal breath sounds.  Abdominal:     General: Bowel sounds are normal. There is no distension.     Palpations: Abdomen is soft.     Tenderness: There is no abdominal tenderness. There is no right CVA tenderness, left CVA tenderness, guarding or rebound.  Musculoskeletal:        General: Normal range of motion.     Cervical back: Normal range of motion.     Right lower leg: No edema.     Left  lower leg: No edema.  Lymphadenopathy:     Cervical: No cervical adenopathy.  Skin:    Capillary Refill: Capillary refill takes less than 2 seconds.  Neurological:     General: No focal deficit present.     Mental Status: She is alert and oriented to person, place, and time. Mental status is at baseline.     Motor: No weakness.  Psychiatric:        Mood and Affect: Mood normal.        Behavior: Behavior normal.        Thought Content: Thought content normal.        Judgment: Judgment normal.     ASSESSMENT/PLAN:  History of miscarriage Assessment & Plan: [id number].  2 previous miscarriages at 8 weeks Consider further workup if further SAB Follows with  OBGYN   History of anemia Assessment & Plan: Not currently symptomatic. Will check CBC and Ferritin at next visit    Family history of MI (myocardial infarction) Assessment & Plan: Paternal history of MI at age 44 Consider cardiac work up in future   HCM Last  PAPA 2022. ASCUS. HPV negative. Due 12/11/23.  Follows with OBGYN OCP for Birth control Recommend Folic Acid 0.4 mg daily Recommend HPV vaccines  PDMP reviewed  Return in about 1 year (around 04/16/2024) for PCP.  Dana Allan, MD

## 2023-04-17 ENCOUNTER — Encounter: Payer: Self-pay | Admitting: Family Medicine

## 2023-04-17 ENCOUNTER — Ambulatory Visit (INDEPENDENT_AMBULATORY_CARE_PROVIDER_SITE_OTHER): Payer: 59 | Admitting: Family Medicine

## 2023-04-17 VITALS — BP 114/66 | HR 75 | Temp 98.2°F | Ht 64.0 in | Wt 132.6 lb

## 2023-04-17 DIAGNOSIS — Z8759 Personal history of other complications of pregnancy, childbirth and the puerperium: Secondary | ICD-10-CM

## 2023-04-17 DIAGNOSIS — Z8249 Family history of ischemic heart disease and other diseases of the circulatory system: Secondary | ICD-10-CM

## 2023-04-17 DIAGNOSIS — Z862 Personal history of diseases of the blood and blood-forming organs and certain disorders involving the immune mechanism: Secondary | ICD-10-CM | POA: Diagnosis not present

## 2023-05-14 ENCOUNTER — Encounter: Payer: Self-pay | Admitting: Family Medicine

## 2023-05-14 DIAGNOSIS — Z8249 Family history of ischemic heart disease and other diseases of the circulatory system: Secondary | ICD-10-CM | POA: Insufficient documentation

## 2023-05-14 NOTE — Assessment & Plan Note (Signed)
Not currently symptomatic. Will check CBC and Ferritin at next visit

## 2023-05-14 NOTE — Assessment & Plan Note (Signed)
Paternal history of MI at age 25 Consider cardiac work up in future

## 2023-05-14 NOTE — Assessment & Plan Note (Signed)
[id number].  2 previous miscarriages at 8 weeks Consider further workup if further SAB Follows with  OBGYN

## 2023-07-13 IMAGING — US US OB < 14 WEEKS - US OB TV
1 series · 14 of 28 positions shown · non-contrast
Comparison: None.

CLINICAL DATA: Vaginal bleeding

EXAM:
OBSTETRIC <14 WK US AND TRANSVAGINAL OB US
TECHNIQUE: Both transabdominal and transvaginal ultrasound examinations were
performed for complete evaluation of the gestation as well as the
maternal uterus, adnexal regions, and pelvic cul-de-sac.
Transvaginal technique was performed to assess early pregnancy.

[Series 1: us ob less than 14 weeks with ob transvaginal · 14 of 112 slices shown]
[im 5/112]
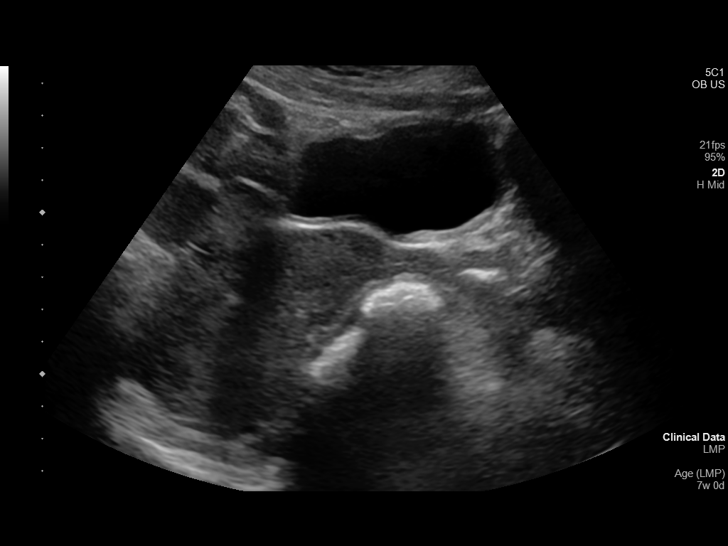
[im 13/112]
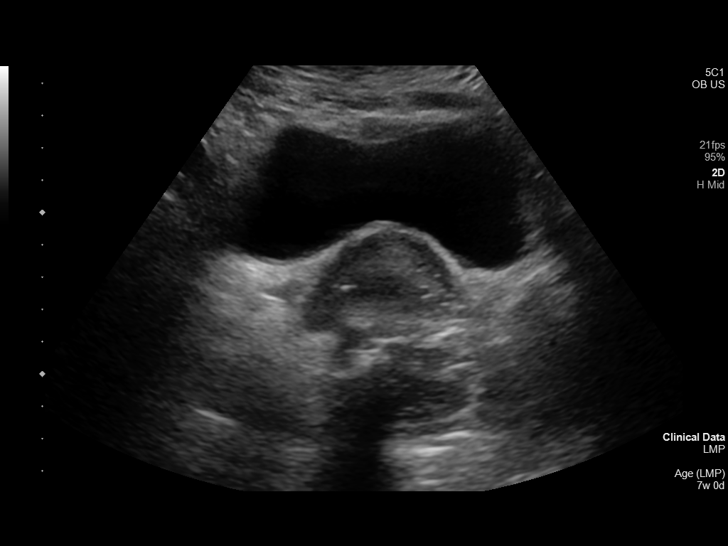
[im 21/112]
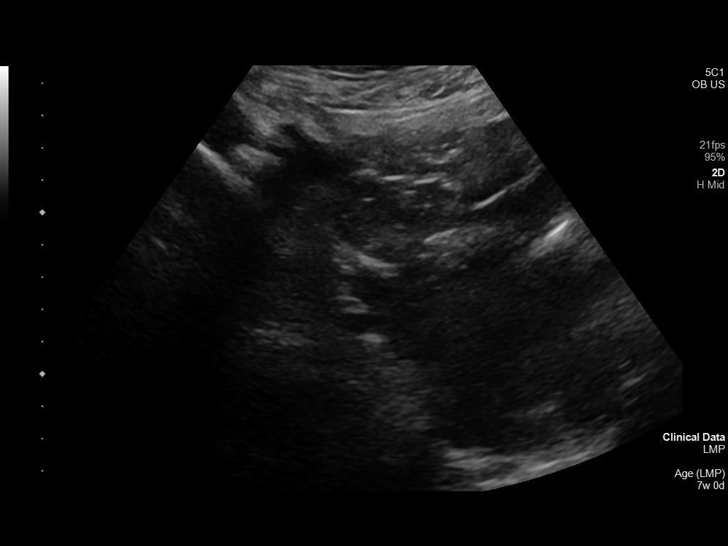
[im 29/112]
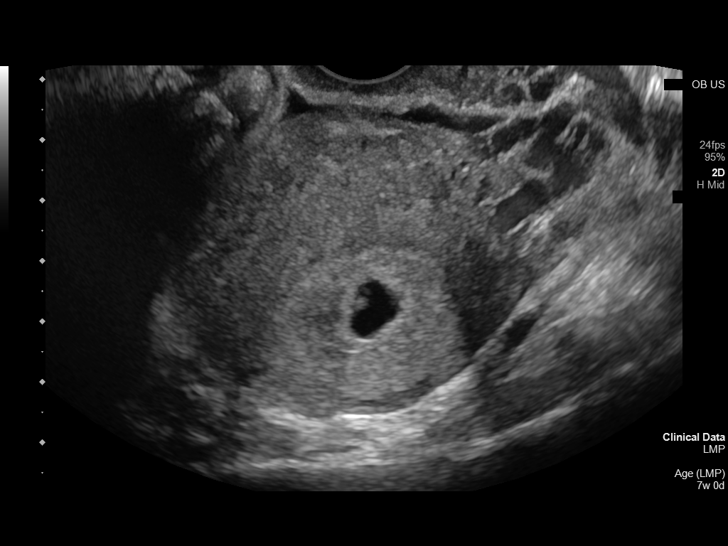
[im 38/112]
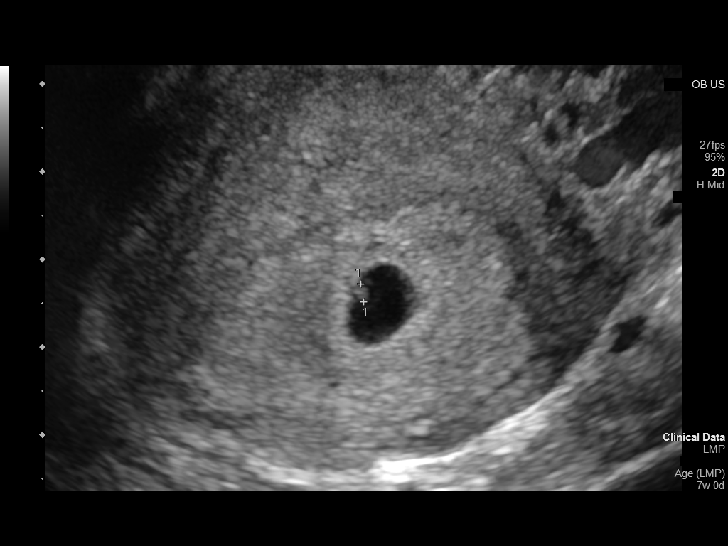
[im 46/112]
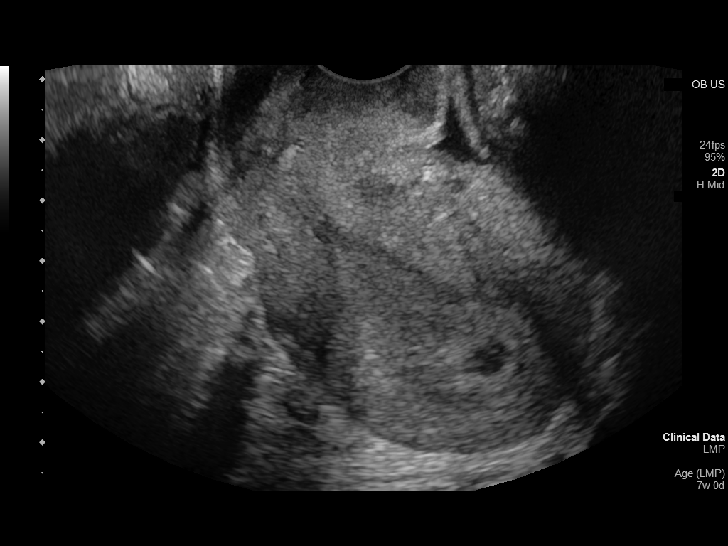
[im 54/112]
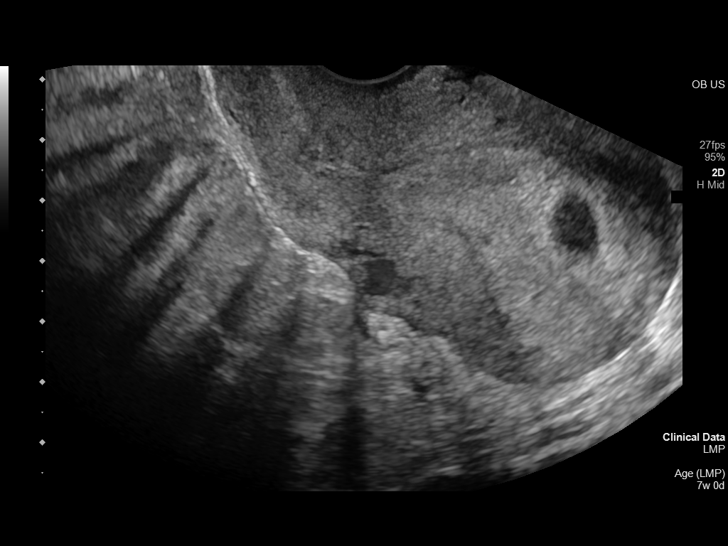
[im 62/112]
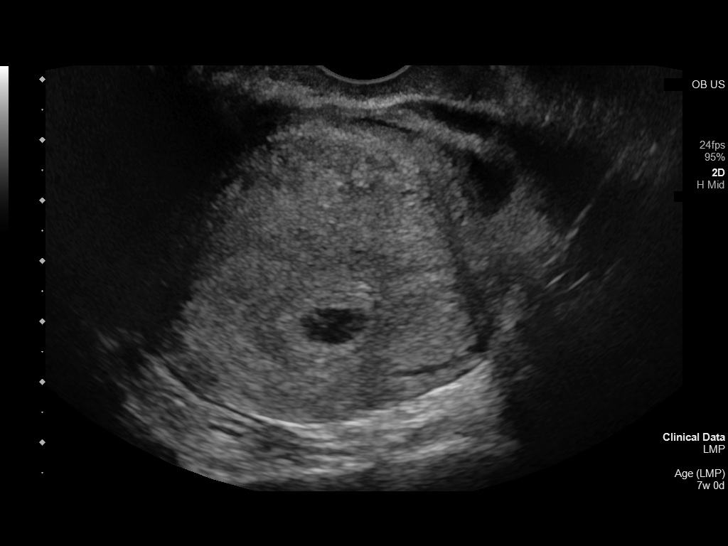
[im 70/112]
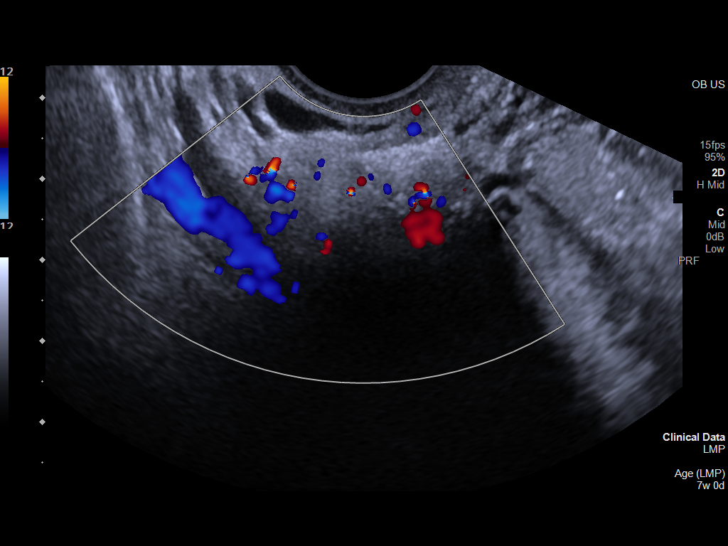
[im 79/112]
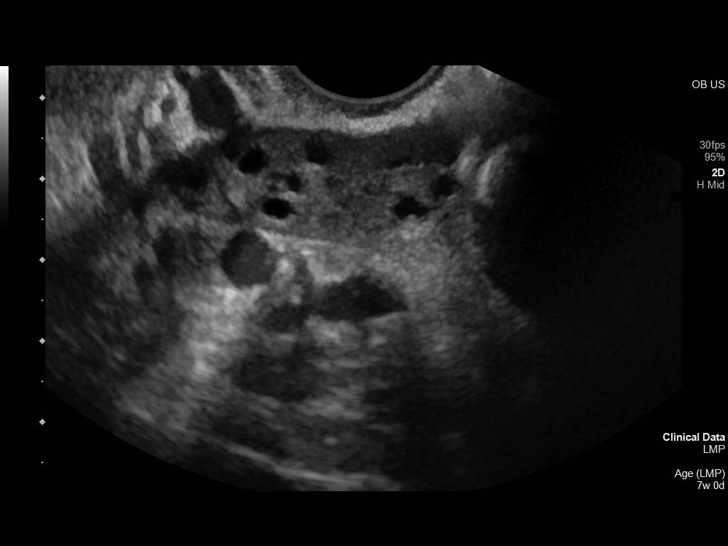
[im 87/112]
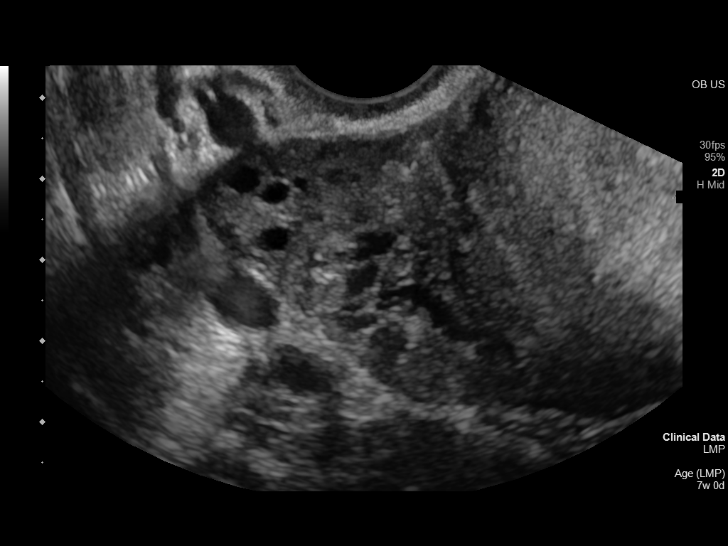
[im 95/112]
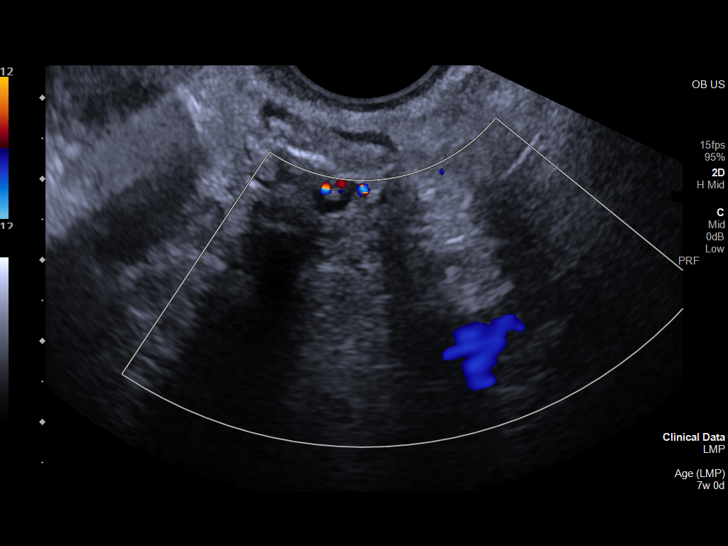
[im 103/112]
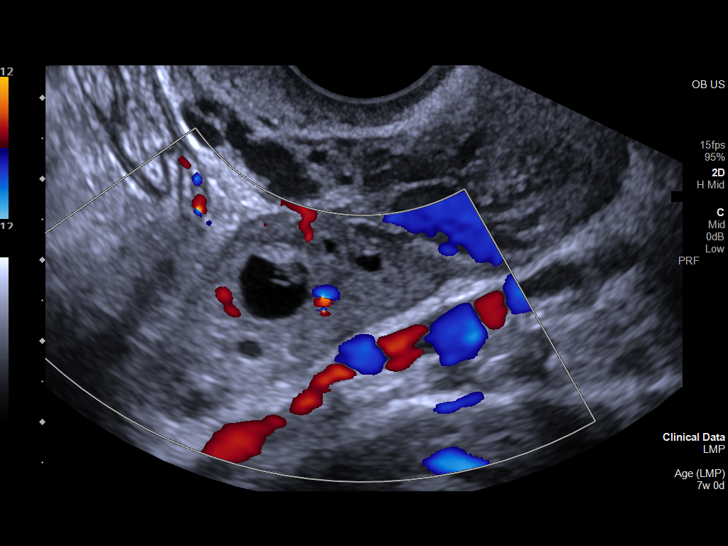
[im 112/112]
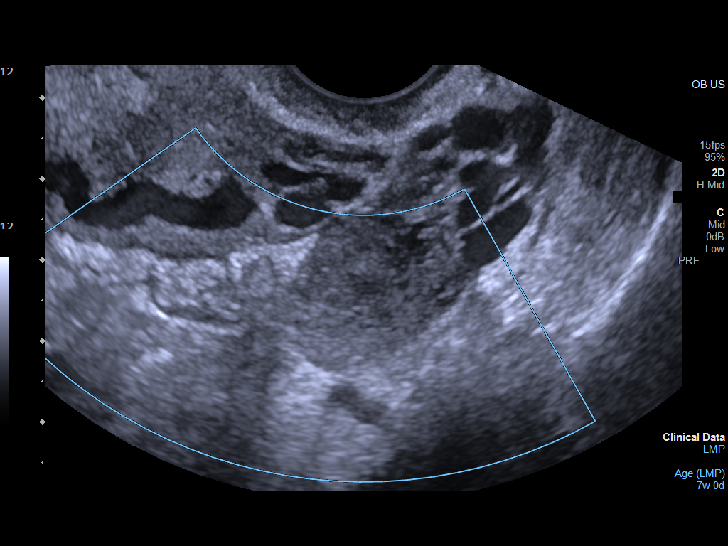

[14 of 28 positions shown; findings below may reference images not displayed]

FINDINGS: Intrauterine gestational sac: Single

Yolk sac:  Visualized.

Embryo:  Visualized.

Cardiac Activity: Visualized.

Heart Rate: 101 bpm

CRL:  2.1 mm   5 w   5 d                  US EDC: 07/20/2022

Subchorionic hemorrhage:  None visualized.

Maternal uterus/adnexae: Retroverted uterus. Bilateral ovaries
within normal limits. No free fluid within the pelvis.
IMPRESSION: 1. Single live intrauterine gestation measuring 5 weeks 5 days by
crown-rump length.
2. Active embryonic heart tones at 101 BPM.

## 2023-07-15 IMAGING — US US OB TRANSVAGINAL
1 series · 15 of 27 positions shown · non-contrast
Comparison: Obstetrical ultrasound 11/22/2021.

CLINICAL DATA: Vaginal bleeding.

EXAM:
OBSTETRIC <14 WK ULTRASOUND
TECHNIQUE: Transvaginal ultrasound was performed for evaluation of the
gestation as well as the maternal uterus and adnexal regions.

[Series 1: us ob transvaginal · 15 of 27 slices shown]
[im 1/27]
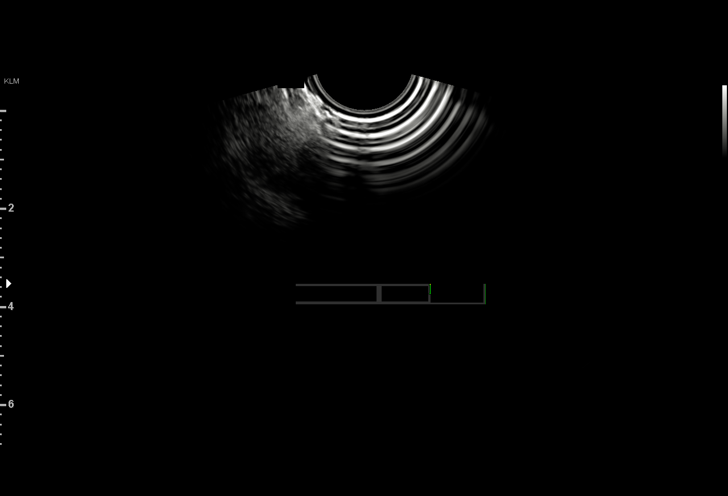
[im 3/27]
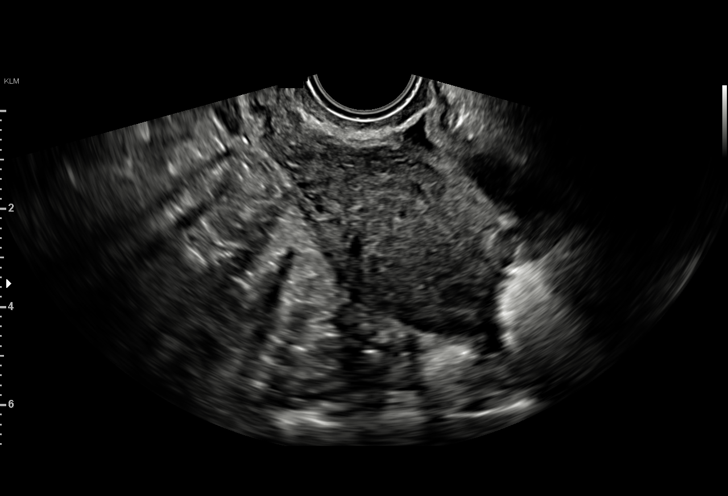
[im 5/27]
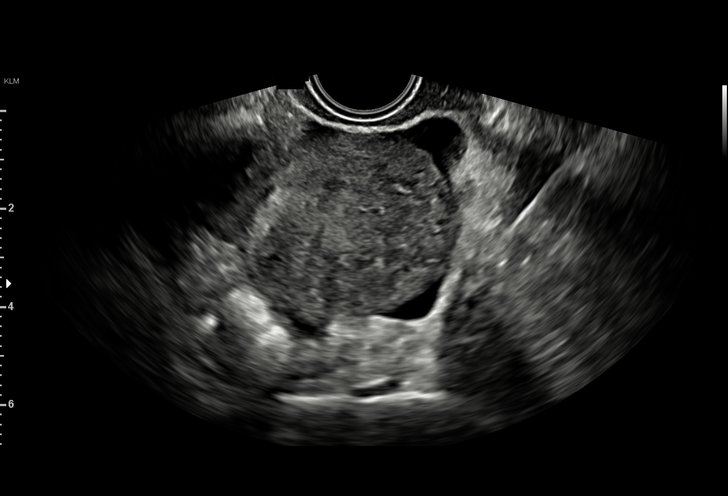
[im 7/27]
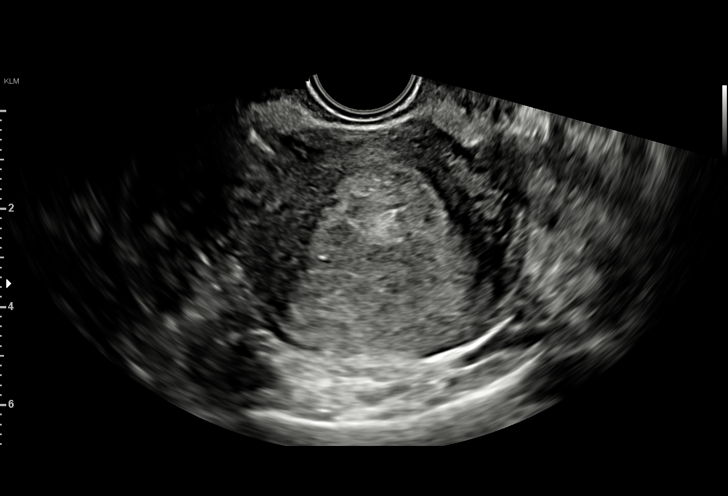
[im 9/27]
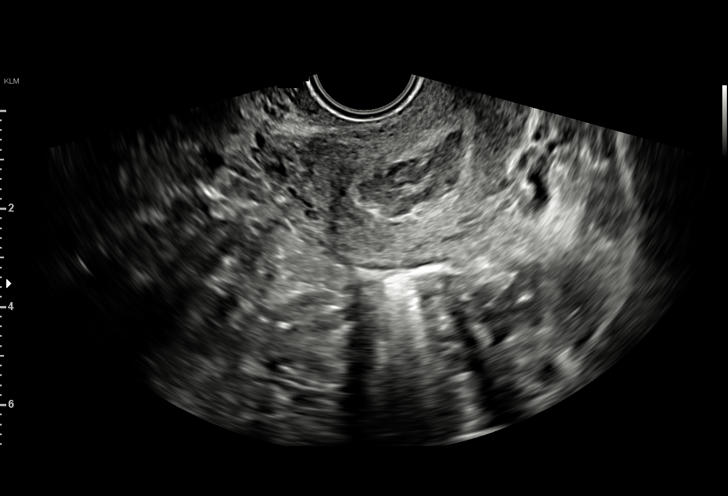
[im 10/27]
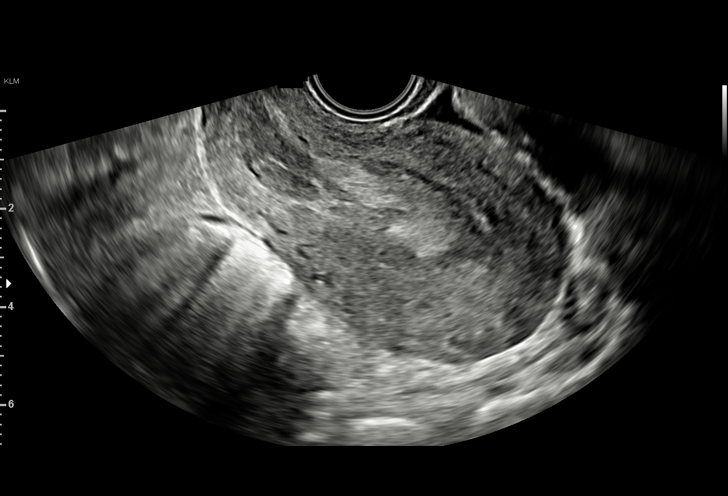
[im 12/27]
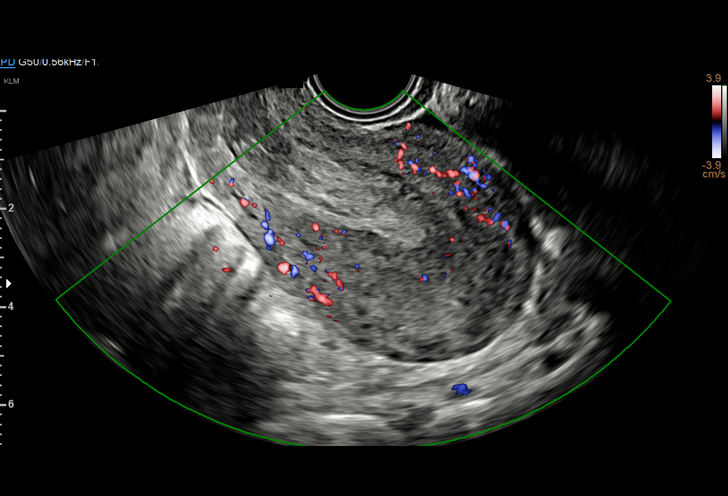
[im 14/27]
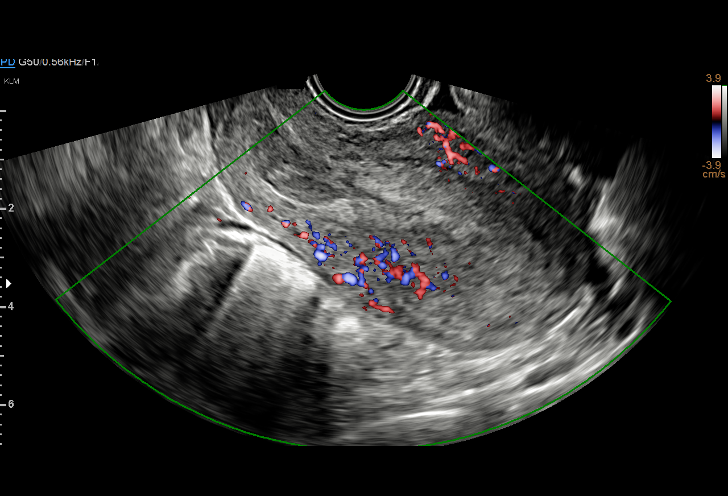
[im 16/27]
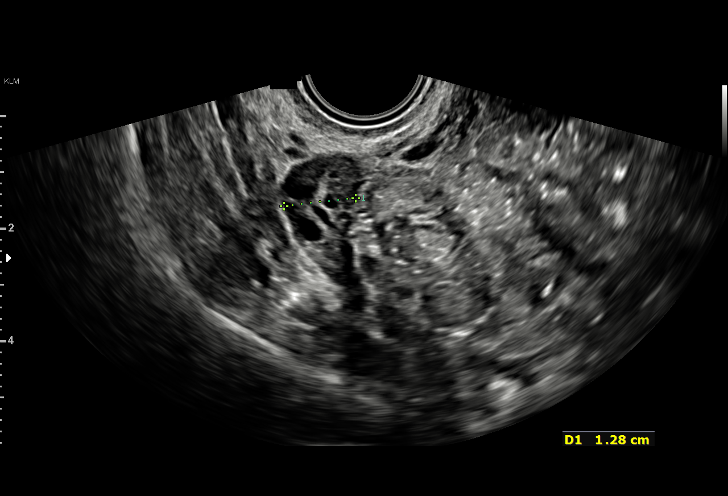
[im 18/27]
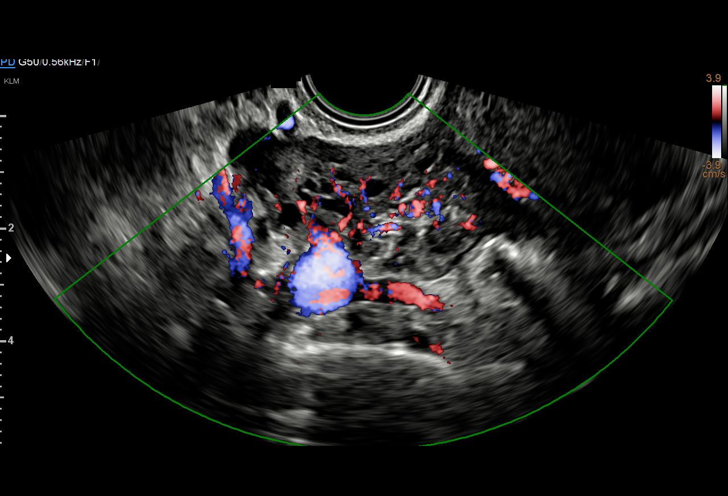
[im 19/27]
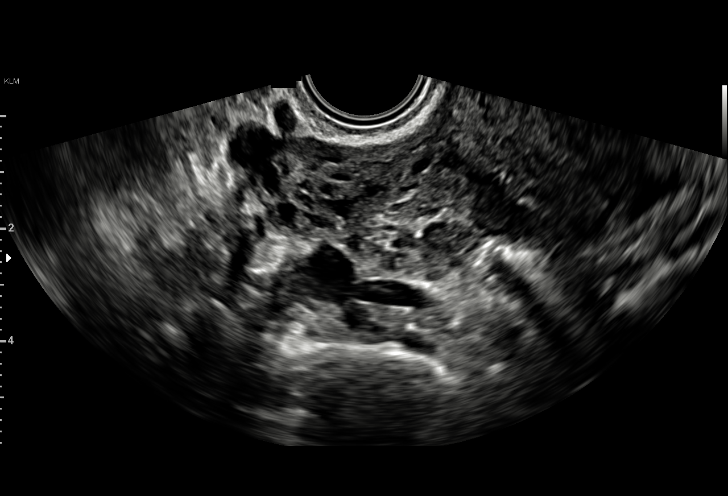
[im 21/27]
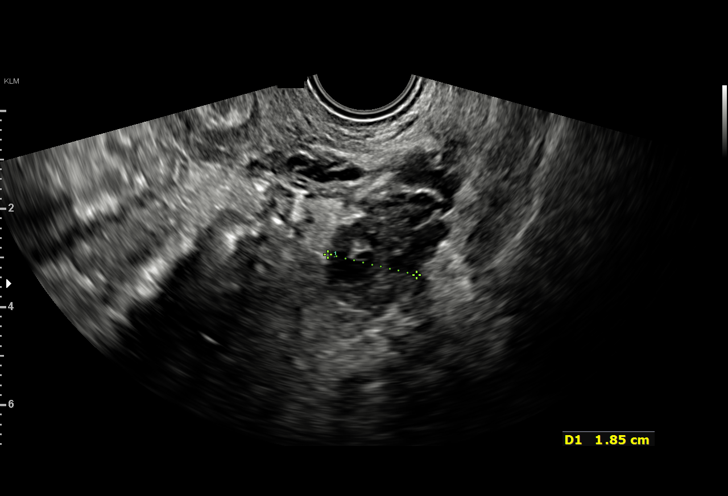
[im 23/27]
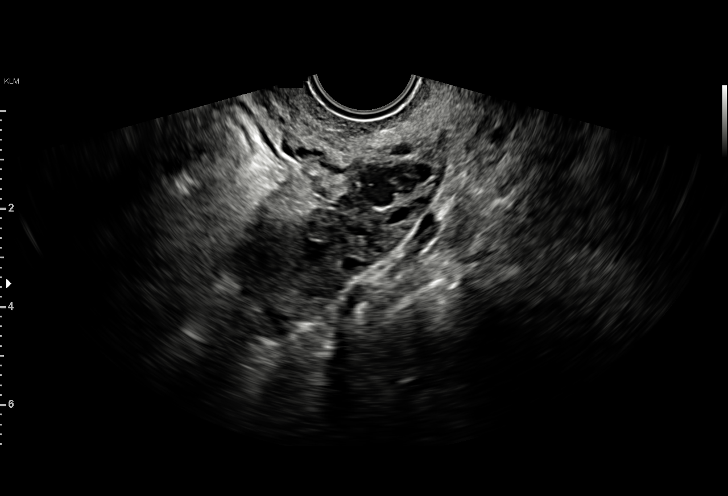
[im 25/27]
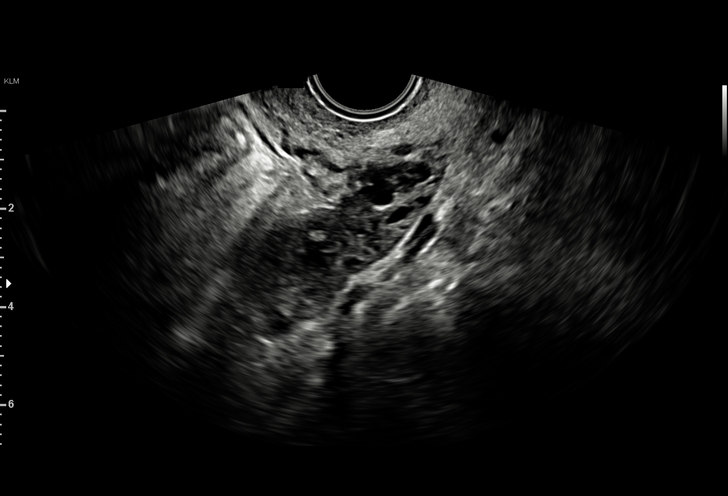
[im 27/27]
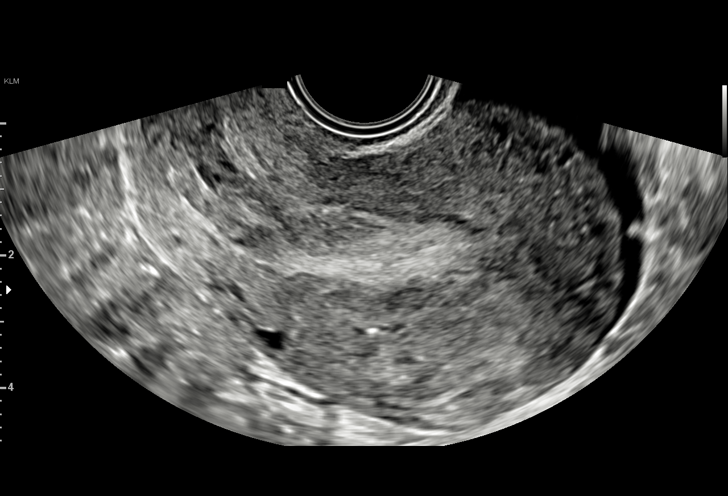

[15 of 27 positions shown; findings below may reference images not displayed]

FINDINGS: Intrauterine gestational sac: None

Yolk sac:  Not Visualized.

Embryo:  Not Visualized.

Cardiac Activity: Not Visualized.

Endometrium: Heterogeneous endometrium measuring 2 cm in thickness.
No vascularity identified within the endometrium.

Maternal uterus/adnexae: Bilateral ovaries appear within normal
limits. There is no pelvic free fluid.
IMPRESSION: 1. No intrauterine gestational sac identified. Heterogeneous
thickened endometrium without vascularity. Given the clinical
history, findings are completed abortion.

## 2023-08-13 ENCOUNTER — Encounter: Payer: Self-pay | Admitting: Emergency Medicine

## 2023-08-13 ENCOUNTER — Other Ambulatory Visit: Payer: Self-pay

## 2023-08-13 ENCOUNTER — Emergency Department: Payer: 59

## 2023-08-13 ENCOUNTER — Emergency Department
Admission: EM | Admit: 2023-08-13 | Discharge: 2023-08-13 | Disposition: A | Discharge status: Home / Self Care | Payer: 59 | Attending: Emergency Medicine | Admitting: Emergency Medicine

## 2023-08-13 DIAGNOSIS — R0602 Shortness of breath: Secondary | ICD-10-CM | POA: Diagnosis not present

## 2023-08-13 DIAGNOSIS — R1011 Right upper quadrant pain: Secondary | ICD-10-CM | POA: Insufficient documentation

## 2023-08-13 DIAGNOSIS — R1013 Epigastric pain: Secondary | ICD-10-CM | POA: Insufficient documentation

## 2023-08-13 LAB — CBC
HCT: 37.5 % (ref 36.0–46.0)
Hemoglobin: 12.6 g/dL (ref 12.0–15.0)
MCH: 30.2 pg (ref 26.0–34.0)
MCHC: 33.6 g/dL (ref 30.0–36.0)
MCV: 89.9 fL (ref 80.0–100.0)
Platelets: 180 10*3/uL (ref 150–400)
RBC: 4.17 MIL/uL (ref 3.87–5.11)
RDW: 12.2 % (ref 11.5–15.5)
WBC: 3.6 10*3/uL — ABNORMAL LOW (ref 4.0–10.5)
nRBC: 0 % (ref 0.0–0.2)

## 2023-08-13 LAB — URINALYSIS, ROUTINE W REFLEX MICROSCOPIC
Bilirubin Urine: NEGATIVE
Glucose, UA: NEGATIVE mg/dL
Ketones, ur: NEGATIVE mg/dL
Nitrite: NEGATIVE
Protein, ur: NEGATIVE mg/dL
Specific Gravity, Urine: 1.024 (ref 1.005–1.030)
pH: 5 (ref 5.0–8.0)

## 2023-08-13 LAB — COMPREHENSIVE METABOLIC PANEL
ALT: 19 U/L (ref 0–44)
AST: 17 U/L (ref 15–41)
Albumin: 4.1 g/dL (ref 3.5–5.0)
Alkaline Phosphatase: 67 U/L (ref 38–126)
Anion gap: 8 (ref 5–15)
BUN: 13 mg/dL (ref 6–20)
CO2: 22 mmol/L (ref 22–32)
Calcium: 8.8 mg/dL — ABNORMAL LOW (ref 8.9–10.3)
Chloride: 108 mmol/L (ref 98–111)
Creatinine, Ser: 0.51 mg/dL (ref 0.44–1.00)
GFR, Estimated: >60 mL/min (ref >60)
Glucose, Bld: 101 mg/dL — ABNORMAL HIGH (ref 70–99)
Potassium: 3.7 mmol/L (ref 3.5–5.1)
Sodium: 138 mmol/L (ref 135–145)
Total Bilirubin: 0.6 mg/dL (ref 0.3–1.2)
Total Protein: 7.7 g/dL (ref 6.5–8.1)

## 2023-08-13 LAB — POC URINE PREG, ED: Preg Test, Ur: NEGATIVE

## 2023-08-13 LAB — D-DIMER, QUANTITATIVE: D-Dimer, Quant: 0.8 ug{FEU}/mL — ABNORMAL HIGH (ref 0.00–0.50)

## 2023-08-13 LAB — LIPASE, BLOOD: Lipase: 34 U/L (ref 11–51)

## 2023-08-13 MED ORDER — IOHEXOL 350 MG/ML SOLN
75.0000 mL | Freq: Once | INTRAVENOUS | Status: AC | PRN
  Administered 2023-08-13: 75 mL via INTRAVENOUS

## 2023-08-13 MED ORDER — OXYCODONE-ACETAMINOPHEN 5-325 MG PO TABS
2.0000 | ORAL_TABLET | Freq: Once | ORAL | Status: AC
  Administered 2023-08-13: 2 via ORAL
  Filled 2023-08-13: qty 2

## 2023-08-13 NOTE — ED Triage Notes (Signed)
Pt here with abd and back since Thurs. Pt states the pain has been constant but more severe when moving. Pt denies NVD. Pt denies fever.

## 2023-08-13 NOTE — ED Notes (Signed)
See triage note  Presents with pain to mid abd and into mid back  States pain started on Thursday  States pain has increased with movement and inspiration  No fever or cough

## 2023-08-13 NOTE — ED Provider Notes (Signed)
[facility] Provider Note    Event Date/Time   First MD Initiated Contact with Patient 08/13/23 1303     (approximate)   History   Abdominal Pain and Back Pain   HPI DELPHINA SCHUM is a 27 y.o. female  with no chronic medical issues who presents for evaluation of pain for three days in her upper abdomen. Started all throughout the upper abdomen, but now has localized to the RUQ.  Radiates through to the back.  Worse with deep breaths and seems to be worse when lying flat.  Denies N/V, lower abdominal pain, dysuria, chest pain.  SOB associated with the pain.  No recent viral symptoms.  No history of blood clots.  No use of contraceptives.  No recent unilateral leg pain nor swelling, no recent immobilizations nor surgeries, no recent travel.      Physical Exam   Triage Vital Signs: ED Triage Vitals [08/13/23 1223]  Encounter Vitals Group     BP 122/68     Systolic BP Percentile      Diastolic BP Percentile      Pulse Rate 91     Resp 17     Temp 98.2 F (36.8 C)     Temp Source Oral     SpO2 98 %     Weight 60.1 kg (132 lb 7.9 oz)     Height 1.626 m (5\' 4" )     Head Circumference      Peak Flow      Pain Score 8     Pain Loc      Pain Education      Exclude from Growth Chart     Most recent vital signs: Vitals:   08/13/23 1938 08/13/23 1938  BP:  107/64  Pulse: 81 78  Resp:  18  Temp:  98.1 F (36.7 C)  SpO2: 99% 100%    General: Awake, no obvious distress until she moves around or lies flat. CV:  Good peripheral perfusion.  Normal heart sounds, regular rate and rhythm. Resp:  Normal effort. Speaking easily and comfortably, no accessory muscle usage nor intercostal retractions.  Lungs clear to auscultation bilaterally. Abd:  No distention.  Mild tenderness to palpation of the epigastrium but moderate to severe tenderness in the right upper quadrant with positive Murphy sign.  No lower abdominal tenderness no guarding.   ED Results  / Procedures / Treatments   Labs (all labs ordered are listed, but only abnormal results are displayed) Labs Reviewed  COMPREHENSIVE METABOLIC PANEL - Abnormal; Notable for the following components:      Result Value   Glucose, Bld 101 (*)    Calcium 8.8 (*)    All other components within normal limits  CBC - Abnormal; Notable for the following components:   WBC 3.6 (*)    All other components within normal limits  URINALYSIS, ROUTINE W REFLEX MICROSCOPIC - Abnormal; Notable for the following components:   Color, Urine YELLOW (*)    APPearance HAZY (*)    Hgb urine dipstick SMALL (*)    Leukocytes,Ua MODERATE (*)    Bacteria, UA RARE (*)    All other components within normal limits  D-DIMER, QUANTITATIVE (NOT AT [facility]) - Abnormal; Notable for the following components:   D-Dimer, Quant 0.80 (*)    All other components within normal limits  LIPASE, BLOOD  POC URINE PREG, ED     RADIOLOGY I viewed and interpreted the patient's right upper quadrant  ultrasound.  See hospital course for details but there are no abnormalities of the gallbladder.  CTA chest pending at time of transfer of care.   PROCEDURES:  Critical Care performed: No  Procedures    IMPRESSION / MDM / ASSESSMENT AND PLAN / ED COURSE  I reviewed the triage vital signs and the nursing notes.                              Differential diagnosis includes, but is not limited to, biliary colic/gallbladder disease, pancreatitis, PE, musculoskeletal strain.  Patient's presentation is most consistent with acute presentation with potential threat to life or bodily function.  Labs/studies ordered: Right upper quadrant ultrasound, CBC, lipase, CMP, urinalysis, D-dimer, urine pregnancy test  Interventions/Medications given:  Medications  iohexol (OMNIPAQUE) 350 MG/ML injection 75 mL (75 mLs Intravenous Contrast Given 08/13/23 1953)  oxyCODONE-acetaminophen (PERCOCET/ROXICET) 5-325 MG per tablet 2 tablet (2 tablets  Oral Given 08/13/23 1936)    (Note:  hospital course my include additional interventions and/or labs/studies not listed above.)   Patient is well-appearing but has had several days of pain that is likely biliary colic but is also somewhat pleuritic in nature.  No risk factors for PE and she is PERC negative but a PE would also explain her symptoms.  Labs are all essentially normal other than some leukocytes in her urine and she may have a mild UTI but this is not explanation for her symptoms.  I will not treat empirically given no dysuria but will send a urine culture.  No transaminitis, no elevated lipase.  Slightly low WBC count, unclear clinical significance.  Awaiting results of D-dimer to further risk ratified and hopefully rule out PE and awaiting the results of the ultrasound.  Patient declines any pain medication at this time.     Clinical Course as of 08/13/23 2105  Sun Aug 13, 2023  1623 D-Dimer, Quant(!): 0.80 D-dimer is elevated above the upper limit of normal.  I also viewed and interpreted the patient's ultrasound and I do not see obvious evidence of cholecystitis but I am awaiting the official radiology report. [CF]  1727 Viewed and interpreted the patient's right upper quadrant ultrasound which shows no evidence of cholelithiasis or cholecystitis.  I talked with her about the reason for the CTA and we are proceeding to rule out pulmonary embolism as a cause of her pleuritic chest pain particularly given the elevated D-dimer. [CF]  1930 Transferring ED care to [facility], [place].  The plan is to follow-up on the CTA chest to specifically rule out pulmonary embolism as a cause of her right upper quadrant/right lower thoracic pain.  Ms. Joseph Art will treat and dispo the patient appropriately based on the results but the patient is hemodynamically stable and I anticipate discharge home with outpatient follow-up will be appropriate. [CF]    Clinical Course User Index [CF] Loleta Rose,  MD     FINAL CLINICAL IMPRESSION(S) / ED DIAGNOSES   Final diagnoses:  Right upper quadrant abdominal pain     Rx / DC Orders   ED Discharge Orders     None        Note:  This document was prepared using Dragon voice recognition software and may include unintentional dictation errors.   Loleta Rose, MD 08/13/23 2105

## 2023-08-13 NOTE — ED Provider Notes (Signed)
Assumed patient care from Corliss Parish, MD.  CT angio chest with no evidence of PE.  Right upper quadrant ultrasound reassuring without acute abnormality.  CBC, CMP and lipase reassuring without acute abnormality.  Recommended continued observation of symptoms at home and return for reevaluation of symptoms seem to be worsening.   Pia Mau [place], PA-C 08/13/23 2023    Loleta Rose, MD 08/13/23 2115

## 2023-08-14 ENCOUNTER — Ambulatory Visit: Payer: 59 | Admitting: Family Medicine

## 2023-08-14 ENCOUNTER — Telehealth: Payer: Self-pay

## 2023-08-14 NOTE — Transitions of Care (Post Inpatient/ED Visit) (Signed)
Unable to reach patient by phone and left v/m requesting call back at [phone].     08/14/2023  Name: Caroline Good MRN: [id number] DOB: 01/24/1996  Today's TOC FU Call Status: Today's TOC FU Call Status:: Unsuccessful Call (1st Attempt) Unsuccessful Call (1st Attempt) Date: 08/14/23  Attempted to reach the patient regarding the most recent Inpatient/ED visit.  Follow Up Plan: Additional outreach attempts will be made to reach the patient to complete the Transitions of Care (Post Inpatient/ED visit) call.   Signature Lewanda Rife, LPN

## 2023-08-15 NOTE — Transitions of Care (Post Inpatient/ED Visit) (Signed)
Unable to reach patient by phone and left v/m requesting call back at [phone].        08/15/2023  Name: Caroline Good MRN: [id number] DOB: 11-21-1995  Today's TOC FU Call Status: Today's TOC FU Call Status:: Unsuccessful Call (2nd Attempt) Unsuccessful Call (1st Attempt) Date: 08/14/23 Unsuccessful Call (2nd Attempt) Date: 08/15/23  Attempted to reach the patient regarding the most recent Inpatient/ED visit.  Follow Up Plan: Additional outreach attempts will be made to reach the patient to complete the Transitions of Care (Post Inpatient/ED visit) call.   Signature Lewanda Rife, LPN

## 2023-08-17 NOTE — Transitions of Care (Post Inpatient/ED Visit) (Signed)
3rd attempt and still unable to reach pt by phone. Sending note to Dr Dana Allan.      08/17/2023  Name: Caroline Good MRN: [id number] DOB: 1995/12/27  Today's TOC FU Call Status: Today's TOC FU Call Status:: Unsuccessful Call (3rd Attempt) Unsuccessful Call (1st Attempt) Date: 08/14/23 Unsuccessful Call (2nd Attempt) Date: 08/15/23 Unsuccessful Call (3rd Attempt) Date: 08/17/23  Attempted to reach the patient regarding the most recent Inpatient/ED visit.  Follow Up Plan: No further outreach attempts will be made at this time. We have been unable to contact the patient.  Signature Lewanda Rife, LPN

## 2023-08-25 ENCOUNTER — Ambulatory Visit (INDEPENDENT_AMBULATORY_CARE_PROVIDER_SITE_OTHER): Payer: 59 | Admitting: Family Medicine

## 2023-08-25 ENCOUNTER — Encounter: Payer: Self-pay | Admitting: Family Medicine

## 2023-08-25 VITALS — BP 98/62 | HR 69 | Temp 98.2°F | Resp 16 | Ht 64.0 in | Wt 134.1 lb

## 2023-08-25 DIAGNOSIS — T148XXA Other injury of unspecified body region, initial encounter: Secondary | ICD-10-CM

## 2023-08-25 DIAGNOSIS — M542 Cervicalgia: Secondary | ICD-10-CM

## 2023-08-25 MED ORDER — BACLOFEN 10 MG PO TABS
5.0000 mg | ORAL_TABLET | Freq: Three times a day (TID) | ORAL | 0 refills | Status: DC
Start: 2023-08-25 — End: 2024-01-04

## 2023-08-25 MED ORDER — IBUPROFEN 600 MG PO TABS
600.0000 mg | ORAL_TABLET | Freq: Three times a day (TID) | ORAL | 0 refills | Status: DC | PRN
Start: 2023-08-25 — End: 2024-01-04

## 2023-08-25 NOTE — Progress Notes (Unsigned)
SUBJECTIVE:   Chief Complaint  Patient presents with  . Shoulder Pain    Starts at the base of the neck down the right arm it been hurting a while    HPI ***  PERTINENT PMH / PSH: ***  OBJECTIVE:  BP 98/62   Pulse 69   Temp 98.2 F (36.8 C)   Resp 16   Ht 5\' 4"  (1.626 m)   Wt 134 lb 2 oz (60.8 kg)   LMP 08/14/2023 (Exact Date)   SpO2 99%   BMI 23.02 kg/m    Physical Exam Vitals reviewed.  Constitutional:      General: She is not in acute distress.    Appearance: Normal appearance. She is normal weight. She is not ill-appearing, toxic-appearing or diaphoretic.  Eyes:     General:        Right eye: No discharge.        Left eye: No discharge.  Neck:     Comments: Negative Spurlings test Cardiovascular:     Rate and Rhythm: Normal rate.  Pulmonary:     Effort: Pulmonary effort is normal.  Musculoskeletal:        General: Normal range of motion.     Right shoulder: Tenderness present. No swelling, deformity, bony tenderness or crepitus. Normal range of motion. Normal strength.     Left shoulder: No swelling, deformity, tenderness, bony tenderness or crepitus. Normal range of motion. Normal strength.     Cervical back: Normal range of motion. No rigidity, torticollis or crepitus. No pain with movement, spinous process tenderness or muscular tenderness. Normal range of motion.     Comments: Negative Hawkins Negative Neers Negative Empty can   Skin:    General: Skin is warm and dry.  Neurological:     General: No focal deficit present.     Mental Status: She is alert and oriented to person, place, and time. Mental status is at baseline.  Psychiatric:        Mood and Affect: Mood normal.        Behavior: Behavior normal.        Thought Content: Thought content normal.        Judgment: Judgment normal.       08/25/2023    8:48 AM 04/17/2023    2:21 PM 12/10/2020    2:30 PM 03/31/2020   11:02 AM 08/17/2017   11:38 AM  Depression screen PHQ 2/9  Decreased  Interest 1 2 1  0 0  Down, Depressed, Hopeless 1 1 0 0 0  PHQ - 2 Score 2 3 1  0 0  Altered sleeping 1 1 2     Tired, decreased energy 1 2 1     Change in appetite 1 1 0    Feeling bad or failure about yourself  0 1 0    Trouble concentrating 1 0 0    Moving slowly or fidgety/restless 0 0 0    Suicidal thoughts 0 0 0    PHQ-9 Score 6 8 4     Difficult doing work/chores Not difficult at all Somewhat difficult         08/25/2023    8:48 AM 04/17/2023    2:22 PM 12/10/2020    2:30 PM  GAD 7 : Generalized Anxiety Score  Nervous, Anxious, on Edge 0 1 0  Control/stop worrying 0 1 2  Worry too much - different things 0 1 2  Trouble relaxing 0 1 1  Restless 0 1 0  Easily annoyed  or irritable 0 2 2  Afraid - awful might happen 0 0 2  Total GAD 7 Score 0 7 9  Anxiety Difficulty Not difficult at all Somewhat difficult     ASSESSMENT/PLAN:  There are no diagnoses linked to this encounter. PDMP reviewed***  No follow-ups on file.  Dana Allan, MD

## 2023-08-25 NOTE — Patient Instructions (Addendum)
It was a pleasure meeting you today. Thank you for allowing me to take part in your health care.  Our goals for today as we discussed include:  Take Baclofen 5 mg at night as needed. Can take up to three times a day. No driving or using heavy machines while taking medication.  Can cause drowsiness. Take Ibuprofen 600 mg three times a day as needed for inflammation  Referral to physical therapy help with neck posture and strengthen muscles   Follow up as needed    If you have any questions or concerns, please do not hesitate to call the office at [phone].  I look forward to our next visit and until then take care and stay safe.  Regards,   Dana Allan, MD   [facility]

## 2023-08-27 ENCOUNTER — Encounter: Payer: Self-pay | Admitting: Family Medicine

## 2023-08-27 DIAGNOSIS — T148XXA Other injury of unspecified body region, initial encounter: Secondary | ICD-10-CM | POA: Insufficient documentation

## 2023-08-27 DIAGNOSIS — M542 Cervicalgia: Secondary | ICD-10-CM | POA: Insufficient documentation

## 2023-08-27 NOTE — Assessment & Plan Note (Addendum)
Chronic pain, previously diagnosed as a pinched nerve, now with radiation down the arm and occasional numbness. No weakness noted on examination. No acute injury reported. History of softball pitching. Prior incomplete course of physical therapy due to COVID-19. No red flags. Negative Spurlings, Hawkins, Neers test.  -Start Baclofen at night for muscle relaxation. -Advise over-the-counter Ibuprofen for inflammation as needed. -Refer to physical therapy for strengthening and posture correction. -Consider further imaging (MRI/CT) or orthopedic consultation if no improvement with conservative measures. -Recent CT C spine 2022 did not show any nerve impingement.
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
## 2024-01-04 ENCOUNTER — Ambulatory Visit: Admitting: Family Medicine

## 2024-01-04 ENCOUNTER — Ambulatory Visit: Admitting: Nurse Practitioner

## 2024-01-04 VITALS — BP 100/60 | HR 84 | Temp 98.2°F | Ht 64.0 in | Wt 136.6 lb

## 2024-01-04 DIAGNOSIS — Z3201 Encounter for pregnancy test, result positive: Secondary | ICD-10-CM

## 2024-01-04 DIAGNOSIS — Z8759 Personal history of other complications of pregnancy, childbirth and the puerperium: Secondary | ICD-10-CM | POA: Diagnosis not present

## 2024-01-04 LAB — HCG, QUANTITATIVE, PREGNANCY: Quantitative HCG: 15901 m[IU]/mL

## 2024-01-04 LAB — POCT URINE PREGNANCY: Preg Test, Ur: POSITIVE — AB

## 2024-01-04 NOTE — Progress Notes (Signed)
 Bethanie Dicker, NP-C Phone: [phone]  Caroline Good is a 28 y.o. female who presents today for possible pregnancy.   Discussed the use of AI scribe software for clinical note transcription with the patient, who gave verbal consent to proceed.  History of Present Illness   Caroline Good "Florentina Addison" is a 28 year old female who presents with a positive pregnancy test and a history of miscarriages.  She has confirmed her pregnancy with both home and clinic tests. She initially tested negative on November 25, 2023, but subsequent tests on December 23, 2023, were positive. Her last menstrual period began on November 01, 2023. There is a discrepancy in gestational age estimates, with one app suggesting five weeks and two days based on the positive test date, and another suggesting eight to nine weeks based on her last menstrual period.  She has a history of two miscarriages, one before and one after the birth of her daughter. During her first miscarriage, low progesterone levels were noted. During her successful pregnancy with her daughter, she was prescribed progesterone suppositories, though she is unsure if her levels were low at that time. She is interested in having her progesterone levels checked again due to her history.  No current symptoms such as bleeding, spotting, or cramping. She takes a prenatal vitamin every morning.      Social History   Tobacco Use  Smoking Status Former   Types: E-cigarettes  Smokeless Tobacco Never  Tobacco Comments   denies    No current outpatient medications on file prior to visit.   No current facility-administered medications on file prior to visit.    ROS see history of present illness  Objective  Physical Exam Vitals:   01/04/24 1106  BP: 100/60  Pulse: 84  Temp: 98.2 F (36.8 C)  SpO2: 99%    BP Readings from Last 3 Encounters:  01/04/24 100/60  08/25/23 98/62  08/13/23 107/64   Wt Readings from Last 3 Encounters:  01/04/24  136 lb 9.6 oz (62 kg)  08/25/23 134 lb 2 oz (60.8 kg)  08/13/23 132 lb 7.9 oz (60.1 kg)    Physical Exam Constitutional:      General: She is not in acute distress.    Appearance: Normal appearance.  HENT:     Head: Normocephalic.  Cardiovascular:     Rate and Rhythm: Normal rate and regular rhythm.     Heart sounds: Normal heart sounds.  Pulmonary:     Effort: Pulmonary effort is normal.     Breath sounds: Normal breath sounds.  Skin:    General: Skin is warm and dry.  Neurological:     General: No focal deficit present.     Mental Status: She is alert.  Psychiatric:        Mood and Affect: Mood normal.        Behavior: Behavior normal.    Assessment/Plan: Please see individual problem list.  Positive urine pregnancy test Assessment & Plan: Pregnancy is confirmed with an uncertain gestational age. She has a history of miscarriages associated with low progesterone levels. Order a quantitative hCG test today and repeat test on Monday to trend level. Order a progesterone level test. Advise contacting an Ob-Gyn for progesterone supplementation. Follow up with Ob-Gyn as scheduled. Instruct her to go to the hospital if heavy bleeding or severe abdominal pain/cramping occurs. Advise no smoking or alcohol. Continue prenatal daily. Handout provided.   Orders: -     hCG, quantitative, pregnancy -  hCG, quantitative, pregnancy; Future  History of miscarriage -     POCT urine pregnancy -     Progesterone -     hCG, quantitative, pregnancy; Future    Return in 4 days (on 01/08/2024) for lab work.   Bethanie Dicker, NP-C [facility]

## 2024-01-04 NOTE — Assessment & Plan Note (Signed)
 Pregnancy is confirmed with an uncertain gestational age. She has a history of miscarriages associated with low progesterone levels. Order a quantitative hCG test today and repeat test on Monday to trend level. Order a progesterone level test. Advise contacting an Ob-Gyn for progesterone supplementation. Follow up with Ob-Gyn as scheduled. Instruct her to go to the hospital if heavy bleeding or severe abdominal pain/cramping occurs. Advise no smoking or alcohol. Continue prenatal daily. Handout provided.

## 2024-01-05 LAB — PROGESTERONE: Progesterone: 11.2 ng/mL

## 2024-01-08 ENCOUNTER — Other Ambulatory Visit (INDEPENDENT_AMBULATORY_CARE_PROVIDER_SITE_OTHER)

## 2024-01-08 DIAGNOSIS — Z8759 Personal history of other complications of pregnancy, childbirth and the puerperium: Secondary | ICD-10-CM

## 2024-01-08 DIAGNOSIS — Z3201 Encounter for pregnancy test, result positive: Secondary | ICD-10-CM | POA: Diagnosis not present

## 2024-01-09 LAB — HCG, QUANTITATIVE, PREGNANCY: Quantitative HCG: 54315 m[IU]/mL

## 2024-01-10 ENCOUNTER — Ambulatory Visit: Admitting: Family Medicine

## 2024-02-06 DIAGNOSIS — Z8759 Personal history of other complications of pregnancy, childbirth and the puerperium: Secondary | ICD-10-CM | POA: Insufficient documentation

## 2024-02-09 DIAGNOSIS — Z2839 Other underimmunization status: Secondary | ICD-10-CM | POA: Insufficient documentation

## 2024-02-13 LAB — PANORAMA PRENATAL TEST FULL PANEL:PANORAMA TEST PLUS 5 ADDITIONAL MICRODELETIONS: FETAL FRACTION: 17.7

## 2024-04-26 DIAGNOSIS — O26899 Other specified pregnancy related conditions, unspecified trimester: Secondary | ICD-10-CM | POA: Insufficient documentation

## 2024-05-09 DIAGNOSIS — O4440 Low lying placenta NOS or without hemorrhage, unspecified trimester: Secondary | ICD-10-CM | POA: Insufficient documentation

## 2024-05-22 ENCOUNTER — Ambulatory Visit: Payer: Self-pay

## 2024-05-22 NOTE — Telephone Encounter (Signed)
 Called pt and she stated that she got an appointment with her OBGYN today.

## 2024-05-22 NOTE — Telephone Encounter (Signed)
 Left message to return call to our office.

## 2024-05-22 NOTE — Telephone Encounter (Signed)
 FYI Only or Action Required?: FYI only for provider.  Patient was last seen in primary care on 01/04/2024 by Gretel App, NP.  Called Nurse Triage reporting Dysuria and Diarrhea.  Symptoms began several weeks ago.  Interventions attempted: Rest, hydration, or home remedies.  Symptoms are: burning and discomfort constant and with urination, urinary frequency, diarrhea (2 episodes x today) unchanged.  Triage Disposition: See Physician Within 24 Hours  Patient/caregiver understands and will follow disposition?:  Yes         Copied from CRM #[id number]. Topic: Clinical - Red Word Triage >> May 22, 2024  9:10 AM Henretta I wrote: Red Word that prompted transfer to Nurse Triage: Potential Uti pain and burning when using bathroom and some diarrhea Reason for Disposition  All other patients with painful urination  (Exception: [1] EITHER frequency or urgency AND [2] has on-call doctor.)  Answer Assessment - Initial Assessment Questions Offered patient next soonest appt on Friday morning with Rollene, she declined. Patient states she doesn't have money for urgent care, she states she doesn't have enough gas to go to mobile medicine clinic or her OBGYN, she states she will go to the ED.   1. SEVERITY: How bad is the pain?  (e.g., Scale 1-10; mild, moderate, or severe)     Discomfort, burning sensation at urethra. Makes me want to cry. Patient states she was told by her OB to take Tylenol  but she has not, as she feels it wouldn't help.  2. FREQUENCY: How many times have you had painful urination today?      4 times.  3. PATTERN: Is pain present every time you urinate or just sometimes?      Constant, every time and present when not urinating.  4. ONSET: When did the painful urination start?      Several weeks, she states she went to her OB and had a urinary test done. She states there was no infection but it was positive for some blood in her urine.  5. FEVER: Do you have a  fever? If Yes, ask: What is your temperature, how was it measured, and when did it start?     No.  6. PAST UTI: Have you had a urine infection before? If Yes, ask: When was the last time? and What happened that time?      Yes, last UTI was when she was pregnant with her daughter (3 years ago). She states that time was similar where she was told they could not find any infection.  7. CAUSE: What do you think is causing the painful urination?  (e.g., UTI, scratch, Herpes sore)     UTI.  8. OTHER SYMPTOMS: Do you have any other symptoms? (e.g., blood in urine, flank pain, genital sores, urgency, vaginal discharge)     Patient denies flank or back pain, nausea, vomiting, frank blood, vaginal discharge, vaginal pain. Patient states she has been having diarrhea since 0800 this morning (2 episodes).  9. PREGNANCY: Is there any chance you are pregnant? When was your last menstrual period?     Yes, [redacted] weeks pregnant.  Protocols used: Urination Pain - Female-A-AH

## 2024-05-25 ENCOUNTER — Other Ambulatory Visit: Payer: Self-pay

## 2024-05-25 ENCOUNTER — Observation Stay

## 2024-05-25 ENCOUNTER — Observation Stay
Admission: EM | Admit: 2024-05-25 | Discharge: 2024-05-26 | Disposition: A | Discharge status: Home / Self Care | Attending: Certified Nurse Midwife | Admitting: Certified Nurse Midwife

## 2024-05-25 DIAGNOSIS — M545 Low back pain, unspecified: Secondary | ICD-10-CM | POA: Insufficient documentation

## 2024-05-25 DIAGNOSIS — Z87891 Personal history of nicotine dependence: Secondary | ICD-10-CM | POA: Insufficient documentation

## 2024-05-25 DIAGNOSIS — O99891 Other specified diseases and conditions complicating pregnancy: Principal | ICD-10-CM

## 2024-05-25 DIAGNOSIS — O26892 Other specified pregnancy related conditions, second trimester: Principal | ICD-10-CM | POA: Insufficient documentation

## 2024-05-25 DIAGNOSIS — O26832 Pregnancy related renal disease, second trimester: Secondary | ICD-10-CM | POA: Insufficient documentation

## 2024-05-25 DIAGNOSIS — N200 Calculus of kidney: Secondary | ICD-10-CM | POA: Insufficient documentation

## 2024-05-25 DIAGNOSIS — Z3A26 26 weeks gestation of pregnancy: Secondary | ICD-10-CM | POA: Insufficient documentation

## 2024-05-25 DIAGNOSIS — M549 Dorsalgia, unspecified: Principal | ICD-10-CM | POA: Diagnosis present

## 2024-05-25 DIAGNOSIS — F419 Anxiety disorder, unspecified: Secondary | ICD-10-CM | POA: Insufficient documentation

## 2024-05-25 LAB — URINALYSIS, COMPLETE (UACMP) WITH MICROSCOPIC
Bilirubin Urine: NEGATIVE
Glucose, UA: NEGATIVE mg/dL
Ketones, ur: 5 mg/dL — AB
Nitrite: NEGATIVE
Protein, ur: NEGATIVE mg/dL
Specific Gravity, Urine: 1.006 (ref 1.005–1.030)
pH: 7 (ref 5.0–8.0)

## 2024-05-25 LAB — WET PREP, GENITAL
Clue Cells Wet Prep HPF POC: NONE SEEN
Sperm: NONE SEEN
Trich, Wet Prep: NONE SEEN
WBC, Wet Prep HPF POC: <10 (ref <10)
Yeast Wet Prep HPF POC: NONE SEEN

## 2024-05-25 LAB — CBC
HCT: 30.3 % — ABNORMAL LOW (ref 36.0–46.0)
Hemoglobin: 10.9 g/dL — ABNORMAL LOW (ref 12.0–15.0)
MCH: 33.5 pg (ref 26.0–34.0)
MCHC: 36 g/dL (ref 30.0–36.0)
MCV: 93.2 fL (ref 80.0–100.0)
Platelets: 195 K/uL (ref 150–400)
RBC: 3.25 MIL/uL — ABNORMAL LOW (ref 3.87–5.11)
RDW: 12.8 % (ref 11.5–15.5)
WBC: 15.8 K/uL — ABNORMAL HIGH (ref 4.0–10.5)
nRBC: 0 % (ref 0.0–0.2)

## 2024-05-25 MED ORDER — ACETAMINOPHEN 500 MG PO TABS
1000.0000 mg | ORAL_TABLET | Freq: Once | ORAL | Status: AC
  Administered 2024-05-25: 1000 mg via ORAL
  Filled 2024-05-25: qty 2

## 2024-05-25 MED ORDER — MORPHINE SULFATE (PF) 2 MG/ML IV SOLN
1.0000 mg | INTRAVENOUS | Status: DC | PRN
  Administered 2024-05-25: 1 mg via INTRAVENOUS
  Filled 2024-05-25: qty 1

## 2024-05-25 MED ORDER — ONDANSETRON HCL 4 MG/2ML IJ SOLN
4.0000 mg | Freq: Four times a day (QID) | INTRAMUSCULAR | Status: DC | PRN
  Administered 2024-05-26 (×2): 4 mg via INTRAVENOUS
  Filled 2024-05-25 (×2): qty 2

## 2024-05-25 MED ORDER — LACTATED RINGERS IV SOLN: INTRAVENOUS | Status: DC

## 2024-05-25 MED ORDER — HYDROMORPHONE HCL 1 MG/ML IJ SOLN
1.0000 mg | INTRAMUSCULAR | Status: DC | PRN
  Administered 2024-05-25 – 2024-05-26 (×5): 1 mg via INTRAVENOUS
  Filled 2024-05-25 (×5): qty 1

## 2024-05-25 NOTE — OB Triage Note (Signed)
 Patient is Caroline Good is [redacted]w[redacted]d is seen at [facility] . Patient is here for lower back pain since 12pm today. Patient states [facility] seen [facility] for a possible uti a few days ago but that was ruled out by practice. Patient states no s/s of UTI as of today. No abnormal discharge reported. Patient feeling baby move. FHR 168. VSS. Provider to be notified . UA obtained.

## 2024-05-25 NOTE — Discharge Summary (Incomplete)
 Patient ID: Caroline Good MRN: [id number] DOB/AGE: Dec 15, 1995 28 y.o.  Admit date: 05/25/2024 Discharge date: 05/26/2024  Admission Diagnoses: Back pain in pregnancy, denies dysuria.  Reports family history of kidney stones.  Discharge Diagnoses: Kidney stone complicating pregnancy  Factors complicating pregnancy: Low lying placenta History of miscarriage in 1st trimester History of GHTN Anxiety Varicella non-immune  Prenatal Procedures: ultrasound  Consults: Urology  Significant Diagnostic Studies:  Results for orders placed or performed during the hospital encounter of 05/25/24 (from the past week)  Urinalysis, Complete w Microscopic -Urine, Clean Catch   Collection Time: 05/25/24  8:40 PM  Result Value Ref Range   Color, Urine STRAW (A) YELLOW   APPearance HAZY (A) CLEAR   Specific Gravity, Urine 1.006 1.005 - 1.030   pH 7.0 5.0 - 8.0   Glucose, UA NEGATIVE NEGATIVE mg/dL   Hgb urine dipstick LARGE (A) NEGATIVE   Bilirubin Urine NEGATIVE NEGATIVE   Ketones, ur 5 (A) NEGATIVE mg/dL   Protein, ur NEGATIVE NEGATIVE mg/dL   Nitrite NEGATIVE NEGATIVE   Leukocytes,Ua TRACE (A) NEGATIVE   RBC / HPF 21-50 0 - 5 RBC/hpf   WBC, UA 0-5 0 - 5 WBC/hpf   Bacteria, UA FEW (A) NONE SEEN   Squamous Epithelial / HPF 0-5 0 - 5 /HPF   Mucus PRESENT   Wet prep, genital   Collection Time: 05/25/24  9:14 PM  Result Value Ref Range   Yeast Wet Prep HPF POC NONE SEEN NONE SEEN   Trich, Wet Prep NONE SEEN NONE SEEN   Clue Cells Wet Prep HPF POC NONE SEEN NONE SEEN   WBC, Wet Prep HPF POC <10 <10   Sperm NONE SEEN   CBC   Collection Time: 05/25/24  9:14 PM  Result Value Ref Range   WBC 15.8 (H) 4.0 - 10.5 K/uL   RBC 3.25 (L) 3.87 - 5.11 MIL/uL   Hemoglobin 10.9 (L) 12.0 - 15.0 g/dL   HCT 69.6 (L) 63.9 - 53.9 %   MCV 93.2 80.0 - 100.0 fL   MCH 33.5 26.0 - 34.0 pg   MCHC 36.0 30.0 - 36.0 g/dL   RDW 87.1 88.4 - 84.4 %   Platelets 195 150 - 400 K/uL   nRBC 0.0 0.0 - 0.2 %  Basic  metabolic panel   Collection Time: 05/26/24 12:06 PM  Result Value Ref Range   Sodium 137 135 - 145 mmol/L   Potassium 3.0 (L) 3.5 - 5.1 mmol/L   Chloride 107 98 - 111 mmol/L   CO2 21 (L) 22 - 32 mmol/L   Glucose, Bld 81 70 - 99 mg/dL   BUN 9 6 - 20 mg/dL   Creatinine, Ser 9.37 0.44 - 1.00 mg/dL   Calcium 7.9 (L) 8.9 - 10.3 mg/dL   GFR, Estimated >39 >39 mL/min   Anion gap 9 5 - 15    Treatments: IV hydration, antibiotics: Rocephin , and analgesia: Dilaudid  and Morphine   Hospital Course:  This is a 28 y.o. G4P0010 with IUP at [redacted]w[redacted]d seen for left sided flank back pain.  She reported this to her prenatal clinic on 7/23 where they did a UA and culture - UA showed moderate amount of blood and urine culture was negative.  She denies UCs. No CVA tenderness on exam. Wet prep was negative, UA again showed large amounts of hgb, and WBC 15.8.  IV fluids started and a dose of Morphine  given.  Discussed plan of care with Dr. Verdon.  Renal u/s ordered  and showed mild hydronephrosis on the left kidney and moderate nephrosis on the right kidney, no stones seen.  Morphine  gave very little relief, pain medication was change to Dilaudid .  Kept her over night for pain management and urology consult. OB u/s ordered with no significant findings other then confirming the known low lying placenta. CT scan showed 1. 4 x 4 mm obstructing calculus at the left UPJ with moderate left-sided hydronephrosis. 2. Moderate to marked right-sided hydronephrosis due to compression of the ureter at the pelvic inlet by the gravid uterus. 3. Small bilateral renal calculi.  She was deemed stable for discharge to home with close outpatient follow up with her OB, and to establish care with [facility].  Discharge Physical Exam:  BP (!) 112/54 (BP Location: Left Arm)   Pulse 76   Temp 98.6 F (37 C) (Oral)   Resp 16   LMP 11/25/2023   SpO2 99%       Discharge Condition: Stable  Disposition: Discharge disposition:  01-Home or Self Care        Allergies as of 05/26/2024   No Known Allergies      Medication List     TAKE these medications    acetaminophen  500 MG tablet Commonly known as: TYLENOL  Take 1 tablet (500 mg total) by mouth every 6 (six) hours as needed.   oxyCODONE  5 MG immediate release tablet Commonly known as: Roxicodone  Take 1 tablet (5 mg total) by mouth every 8 (eight) hours as needed.   potassium chloride  SA 20 MEQ tablet Commonly known as: KLOR-CON  M Take 1 tablet (20 mEq total) by mouth daily.   tamsulosin  0.4 MG Caps capsule Commonly known as: FLOMAX  Take 1 capsule (0.4 mg total) by mouth daily. Start taking on: May 27, 2024         Follow-up Information     [facility]. Call.   Why: to establish care Contact information: [address] [place]  [address] [phone]        [facility] Follow up.   Why: Call with any questions or concerns Contact information: [address] [place]  [address] [phone]        Rubbie Manuelita BROCKS, FNP. Schedule an appointment as soon as possible for a visit in 1 day(s).   Specialty: Family Medicine Why: follow up to your admission  [facility]  [address]  [address]  [place], [place] [address]  [phone]                Signed:  DELON MYRON HOWARD 05/26/2024 6:35 PM

## 2024-05-26 ENCOUNTER — Observation Stay

## 2024-05-26 DIAGNOSIS — O26892 Other specified pregnancy related conditions, second trimester: Secondary | ICD-10-CM | POA: Diagnosis not present

## 2024-05-26 DIAGNOSIS — O99891 Other specified diseases and conditions complicating pregnancy: Principal | ICD-10-CM

## 2024-05-26 LAB — BASIC METABOLIC PANEL WITH GFR
Anion gap: 9 (ref 5–15)
BUN: 9 mg/dL (ref 6–20)
CO2: 21 mmol/L — ABNORMAL LOW (ref 22–32)
Calcium: 7.9 mg/dL — ABNORMAL LOW (ref 8.9–10.3)
Chloride: 107 mmol/L (ref 98–111)
Creatinine, Ser: 0.62 mg/dL (ref 0.44–1.00)
GFR, Estimated: >60 mL/min (ref >60)
Glucose, Bld: 81 mg/dL (ref 70–99)
Potassium: 3 mmol/L — ABNORMAL LOW (ref 3.5–5.1)
Sodium: 137 mmol/L (ref 135–145)

## 2024-05-26 MED ORDER — TAMSULOSIN HCL 0.4 MG PO CAPS
0.4000 mg | ORAL_CAPSULE | Freq: Every day | ORAL | Status: DC
  Administered 2024-05-26: 0.4 mg via ORAL
  Filled 2024-05-26: qty 1

## 2024-05-26 MED ORDER — TAMSULOSIN HCL 0.4 MG PO CAPS: 0.4000 mg | ORAL_CAPSULE | Freq: Every day | ORAL | 0 refills | Status: AC

## 2024-05-26 MED ORDER — POTASSIUM CHLORIDE CRYS ER 20 MEQ PO TBCR: 20.0000 meq | EXTENDED_RELEASE_TABLET | Freq: Every day | ORAL | 0 refills | Status: AC

## 2024-05-26 MED ORDER — SODIUM CHLORIDE 0.9 % IV SOLN
1.0000 g | INTRAVENOUS | Status: DC
  Administered 2024-05-26: 1 g via INTRAVENOUS
  Filled 2024-05-26: qty 10

## 2024-05-26 MED ORDER — ACETAMINOPHEN 500 MG PO TABS: 500.0000 mg | ORAL_TABLET | Freq: Four times a day (QID) | ORAL | Status: AC | PRN

## 2024-05-26 MED ORDER — ACETAMINOPHEN 500 MG PO TABS
1000.0000 mg | ORAL_TABLET | Freq: Once | ORAL | Status: AC
  Administered 2024-05-26: 1000 mg via ORAL
  Filled 2024-05-26: qty 2

## 2024-05-26 MED ORDER — OXYCODONE HCL 5 MG PO TABS: 5.0000 mg | ORAL_TABLET | Freq: Three times a day (TID) | ORAL | 0 refills | Status: AC | PRN

## 2024-05-26 NOTE — H&P (Addendum)
 HISTORY AND PHYSICAL NOTE  History of Present Illness: Caroline Good is a 28 y.o. G4P0010 at [redacted]w[redacted]d admitted for severe left sided back pain.    Factors complicating pregnancy:  Low lying placenta History of miscarriage in 1st trimester History of GHTN Anxiety Varicella non-immune  Prenatal care site:  [facility]  Patient Active Problem List   Diagnosis Date Noted   Back pain affecting pregnancy 05/25/2024   Anxiety 05/25/2024   Low lying placenta, antepartum 05/09/2024   Pelvic pain affecting pregnancy, antepartum 04/26/2024   Maternal varicella, non-immune 02/09/2024   History of gestational hypertension 02/06/2024   Positive urine pregnancy test 01/04/2024   Muscle strain 08/27/2023   Neck pain on right side 08/27/2023   Family history of MI (myocardial infarction) 05/14/2023   Abnormal Pap smear of cervix 02/08/2021   Supervision of high risk pregnancy, antepartum 12/10/2020   History of miscarriage 03/31/2020   History of anemia 12/30/2019    Past Medical History:  Diagnosis Date   Anxiety    Encounter for supervision of normal pregnancy, antepartum 12/10/2020                 FAMILY TREE         LAB RESULTS      Language    English    Pap    12/10/20: ASCUS w/ -HRHPV      Initiated care at    12wk    GC/CT    Initial:  -/-          36wks:      Dating by    LMP c/w 10 U/S                Support person         Genetics    NT/IT:  neg   AFP:      Panorama:                 Carrier Screen           Flu vaccine    declined    Howard/Hgb Elec           TDaP vaccine     Heart murmur    Miscarriage    Spontaneous abortion with septicemia 12/27/2019    Past Surgical History:  Procedure Laterality Date   TONSILLECTOMY      OB History  Gravida Para Term Preterm AB Living  4 0 0 0 1 0  SAB IAB Ectopic Multiple Live Births  1 0 0 0 0    # Outcome Date GA Lbr Len/2nd Weight Sex Type Anes PTL Lv  4 Current           3 Gravida           2 Gravida           1 SAB              Social History:  reports that she has quit smoking. Her smoking use included e-cigarettes. She has never used smokeless tobacco. She reports that she does not drink alcohol and does not use drugs.  Family History: family history includes Depression in her mother, paternal grandfather, and paternal grandmother; Hearing loss in her paternal grandfather; Heart attack in her father and maternal grandfather; Heart disease in her maternal grandfather and paternal grandfather; Hyperlipidemia in her father; Hypertension in her father and mother; Miscarriages / [place] in her father and mother.  No Known Allergies  No medications prior  to admission.      Physical Examination: Vitals:  BP 112/65 (BP Location: Left Arm)   Pulse 84   Temp 98.2 F (36.8 C) (Oral)   Resp 14   LMP 11/25/2023   SpO2 97%  General: no acute distress.  HEENT: normocephalic, atraumatic Heart: regular rate & rhythm Lungs: normal respiratory effort Abdomen: soft, gravid, non-tender Extremities: non-tender, symmetric, no edema bilaterally Neurologic: Alert & oriented x 3.      Labs:  Results for orders placed or performed during the hospital encounter of 05/25/24 (from the past 24 hours)  Urinalysis, Complete w Microscopic -Urine, Clean Catch   Collection Time: 05/25/24  8:40 PM  Result Value Ref Range   Color, Urine STRAW (A) YELLOW   APPearance HAZY (A) CLEAR   Specific Gravity, Urine 1.006 1.005 - 1.030   pH 7.0 5.0 - 8.0   Glucose, UA NEGATIVE NEGATIVE mg/dL   Hgb urine dipstick LARGE (A) NEGATIVE   Bilirubin Urine NEGATIVE NEGATIVE   Ketones, ur 5 (A) NEGATIVE mg/dL   Protein, ur NEGATIVE NEGATIVE mg/dL   Nitrite NEGATIVE NEGATIVE   Leukocytes,Ua TRACE (A) NEGATIVE   RBC / HPF 21-50 0 - 5 RBC/hpf   WBC, UA 0-5 0 - 5 WBC/hpf   Bacteria, UA FEW (A) NONE SEEN   Squamous Epithelial / HPF 0-5 0 - 5 /HPF   Mucus PRESENT   Wet prep, genital   Collection Time: 05/25/24  9:14 PM  Result Value Ref  Range   Yeast Wet Prep HPF POC NONE SEEN NONE SEEN   Trich, Wet Prep NONE SEEN NONE SEEN   Clue Cells Wet Prep HPF POC NONE SEEN NONE SEEN   WBC, Wet Prep HPF POC <10 <10   Sperm NONE SEEN   CBC   Collection Time: 05/25/24  9:14 PM  Result Value Ref Range   WBC 15.8 (H) 4.0 - 10.5 K/uL   RBC 3.25 (L) 3.87 - 5.11 MIL/uL   Hemoglobin 10.9 (L) 12.0 - 15.0 g/dL   HCT 69.6 (L) 63.9 - 53.9 %   MCV 93.2 80.0 - 100.0 fL   MCH 33.5 26.0 - 34.0 pg   MCHC 36.0 30.0 - 36.0 g/dL   RDW 87.1 88.4 - 84.4 %   Platelets 195 150 - 400 K/uL   nRBC 0.0 0.0 - 0.2 %    Imaging Studies: US  RENAL Result Date: 05/25/2024 CLINICAL DATA:  Back pain.  Pregnant. EXAM: RENAL / URINARY TRACT ULTRASOUND COMPLETE COMPARISON:  None. FINDINGS: Right Kidney: Renal measurements: 12.1 x 6.9 x 6.9 cm = volume: 301 mL. Echogenicity within normal limits. There is severe right hydronephrosis. No focal mass. Left Kidney: Renal measurements: 11.3 x 6.2 x 6.0 cm = volume: 219 mL. Echogenicity within normal limits. There is mild hydronephrosis. No focal mass. Bladder: Appears normal for degree of bladder distention. Other: None. IMPRESSION: Severe right and mild left hydronephrosis. Electronically Signed   By: Greig Pique M.D.   On: 05/25/2024 22:19    Assessment and Plan: Patient Active Problem List   Diagnosis Date Noted   Back pain affecting pregnancy 05/25/2024   Anxiety 05/25/2024   Low lying placenta, antepartum 05/09/2024   Pelvic pain affecting pregnancy, antepartum 04/26/2024   Maternal varicella, non-immune 02/09/2024   History of gestational hypertension 02/06/2024   Positive urine pregnancy test 01/04/2024   Muscle strain 08/27/2023   Neck pain on right side 08/27/2023   Family history of MI (myocardial infarction) 05/14/2023   Abnormal Pap  smear of cervix 02/08/2021   Supervision of high risk pregnancy, antepartum 12/10/2020   History of miscarriage 03/31/2020   History of anemia 12/30/2019    1. Admit  to Obs - Collect labs - results above - Renal u/s - Urology consult - Pain management - NST q shift  Margery FORBES Coe, CNM  Certified Nurse Midwife [facility]

## 2024-05-26 NOTE — OB Triage Note (Signed)
 Pt given discharge instructions including follow up care and prescriptions. Pt verbalized understanding and discharged with family member.

## 2024-05-26 NOTE — Consult Note (Addendum)
 Consultation: Left flank pain, left greater than right hydronephrosis Requested by: Dr. Heather Penton  History of Present Illness: Caroline Good is a 28 yo female at [redacted]w[redacted]d admitted for severe left sided back pain. A7/26/2025 Renal US  with mod-sev right, mild left hydronephrosis. No stone seen.  She is feeling better this afternoon.  Flank pain is improved and mostly resolved.  No dysuria or gross hematuria.  She had not seen a stone pass.  She has no history of stones.  She was seen and examined with her dad present.   WbC 15.8. UA - few bac, rbc 21-50, wbc 0-5, N neg, LE trace. Cr 0.6.   Past Medical History:  Diagnosis Date   Anxiety    Encounter for supervision of normal pregnancy, antepartum 12/10/2020                 FAMILY TREE         LAB RESULTS      Language    English    Pap    12/10/20: ASCUS w/ -HRHPV      Initiated care at    12wk    GC/CT    Initial:  -/-          36wks:      Dating by    LMP c/w 10 U/S                Support person         Genetics    NT/IT:  neg   AFP:      Panorama:                 Carrier Screen           Flu vaccine    declined    Calvert/Hgb Elec           TDaP vaccine     Heart murmur    Miscarriage    Spontaneous abortion with septicemia 12/27/2019   Past Surgical History:  Procedure Laterality Date   TONSILLECTOMY      Home Medications:  No medications prior to admission.   Allergies: No Known Allergies  Family History  Problem Relation Age of Onset   Hypertension Mother    Depression Mother    Miscarriages / [place] Mother    Hypertension Father    Hyperlipidemia Father    Miscarriages / Stillbirths Father    Heart attack Father    Heart disease Maternal Grandfather    Heart attack Maternal Grandfather    Depression Paternal Grandmother    Hearing loss Paternal Grandfather    Depression Paternal Grandfather    Heart disease Paternal Grandfather    Social History:  reports that she has quit smoking. Her smoking use included e-cigarettes. She  has never used smokeless tobacco. She reports that she does not drink alcohol and does not use drugs.  ROS: A complete review of systems was performed.  All systems are negative except for pertinent findings as noted. Review of Systems  All other systems reviewed and are negative.    Physical Exam:  Vital signs in last 24 hours: Temp:  [98.1 F (36.7 C)-98.5 F (36.9 C)] 98.5 F (36.9 C) (07/27 1041) Pulse Rate:  [81-90] 89 (07/27 1041) Resp:  [14-16] 16 (07/27 1041) BP: (107-126)/(63-78) 111/63 (07/27 1041) SpO2:  [96 %-99 %] 97 % (07/27 1041) General:  Alert and oriented, No acute distress HEENT: Normocephalic, atraumatic Cardiovascular: Regular rate and rhythm Lungs: Regular rate and effort Abdomen:  Soft, nontender, nondistended, no abdominal masses, gravid Back: No CVA tenderness Extremities: No edema Neurologic: Grossly intact  Laboratory Data:  Results for orders placed or performed during the hospital encounter of 05/25/24 (from the past 24 hours)  Urinalysis, Complete w Microscopic -Urine, Clean Catch     Status: Abnormal   Collection Time: 05/25/24  8:40 PM  Result Value Ref Range   Color, Urine STRAW (A) YELLOW   APPearance HAZY (A) CLEAR   Specific Gravity, Urine 1.006 1.005 - 1.030   pH 7.0 5.0 - 8.0   Glucose, UA NEGATIVE NEGATIVE mg/dL   Hgb urine dipstick LARGE (A) NEGATIVE   Bilirubin Urine NEGATIVE NEGATIVE   Ketones, ur 5 (A) NEGATIVE mg/dL   Protein, ur NEGATIVE NEGATIVE mg/dL   Nitrite NEGATIVE NEGATIVE   Leukocytes,Ua TRACE (A) NEGATIVE   RBC / HPF 21-50 0 - 5 RBC/hpf   WBC, UA 0-5 0 - 5 WBC/hpf   Bacteria, UA FEW (A) NONE SEEN   Squamous Epithelial / HPF 0-5 0 - 5 /HPF   Mucus PRESENT   CBC     Status: Abnormal   Collection Time: 05/25/24  9:14 PM  Result Value Ref Range   WBC 15.8 (H) 4.0 - 10.5 K/uL   RBC 3.25 (L) 3.87 - 5.11 MIL/uL   Hemoglobin 10.9 (L) 12.0 - 15.0 g/dL   HCT 69.6 (L) 63.9 - 53.9 %   MCV 93.2 80.0 - 100.0 fL   MCH 33.5  26.0 - 34.0 pg   MCHC 36.0 30.0 - 36.0 g/dL   RDW 87.1 88.4 - 84.4 %   Platelets 195 150 - 400 K/uL   nRBC 0.0 0.0 - 0.2 %  Wet prep, genital     Status: None   Collection Time: 05/25/24  9:14 PM  Result Value Ref Range   Yeast Wet Prep HPF POC NONE SEEN NONE SEEN   Trich, Wet Prep NONE SEEN NONE SEEN   Clue Cells Wet Prep HPF POC NONE SEEN NONE SEEN   WBC, Wet Prep HPF POC <10 <10   Sperm NONE SEEN   Basic metabolic panel     Status: Abnormal   Collection Time: 05/26/24 12:06 PM  Result Value Ref Range   Sodium 137 135 - 145 mmol/L   Potassium 3.0 (L) 3.5 - 5.1 mmol/L   Chloride 107 98 - 111 mmol/L   CO2 21 (L) 22 - 32 mmol/L   Glucose, Bld 81 70 - 99 mg/dL   BUN 9 6 - 20 mg/dL   Creatinine, Ser 9.37 0.44 - 1.00 mg/dL   Calcium 7.9 (L) 8.9 - 10.3 mg/dL   GFR, Estimated >39 >39 mL/min   Anion gap 9 5 - 15   Recent Results (from the past 240 hours)  Wet prep, genital     Status: None   Collection Time: 05/25/24  9:14 PM  Result Value Ref Range Status   Yeast Wet Prep HPF POC NONE SEEN NONE SEEN Final   Trich, Wet Prep NONE SEEN NONE SEEN Final   Clue Cells Wet Prep HPF POC NONE SEEN NONE SEEN Final   WBC, Wet Prep HPF POC <10 <10 Final   Sperm NONE SEEN  Final    Comment: Performed at [facility], [address], [place], [place] [address]   Creatinine: Recent Labs    05/26/24 1206  CREATININE 0.62    Impression/Assessment: 28 yo female at [redacted]w[redacted]d admitted for severe left sided back pain. A7/26/2025 Renal US  with  mod-sev right, mild left hydronephrosis -discussed with patient and her dad hydronephrosis is not uncommon during pregnancy.  Ultrasound imaging not completely diagnostic.  Fortunately she is feeling better this afternoon and renal function is good.   Plan/recommendations:   -repeat CBC, culture urine and cover with UTI abx   -Monitor with hydration, analgesia, antibiotics (if infection is present), antiemetics, tamsulosin  if acceptable and strain  urine.   -If WBC improves and she remains with minimal pain, OK for discharge from GU pt of view. Return precautions given. Discussed multiple hospitalizations for fetal monitoring and intravenous hydration are not uncommon if pain returns.   -Second and third trimesters are candidates for low-dose CT if pain continues as ultrasound is/was not completely diagnostic. Any SIRS, fever, uncontrolled pain, emesis, etc then recommend ASAP low dose CT.   Since she feels better this afternoon and looks good on exam will sign off. Please page GU anytime for questions, concerns, changes in patient status.   Addendum: Dr. Verdon paged and we discussed Katie's CT, management and follow-up. CT shows a 5 mm right upper pole stone with right hydroureteronephrosis but no ureteral stone and a left 4 to 5 mm UPJ stone.  Again Caroline Good has been afebrile and her pain is improved.  No change in management with continued stone passage.  Management with acetaminophen , tamsulosin  and pain meds if appropriate.  Return precautions discussed and the CT will help expedite evaluation and treatment should she return with any significant or concerning symptoms. Her OB care is at [facility] and she should follow-up with them soon for renal fxn monitoring as well as [facility] urology to consolidate care or with [facility].   Donnice Brooks 05/26/2024, 4:09 PM

## 2024-05-26 NOTE — Progress Notes (Signed)
 Progress note:  Pt presenting with 5 days of left flank pain. Described as intermittently sharp and cramping, dull achy and tearing. Radiation to the left lower flank and anteriorly. Today the pain has moved to include the right flank minimally. Good FM, no LOF, no VB or discharge.  UA/Culture normal on Wednesday in her OB office, except for blood. She took macrobid x2 days ppx and stopped when the culture returned negative. Her pain has worsened since Wednesday. Zofran  helping with nausea, which is otherwise minimal. Her pain and nausea do worsen after eating over the last two days.  Renal ultrasound negative for stones, but moderate to severe right hydro and mild left hydro. UA here with large blood, no nitrites, trace leukocytes, 1.006 SG. WBC 15.9. Afebrile.  Urology consult recommends expectant management for now. Wet prep is negative.  She does have a posterior placenta previa. Last MFM scan on 05/09/24 The placenta appears to be posterior and low-lying or a complete previa as noted on 04/09/2024 at her anatomy US .  O: Vitals:   05/26/24 0752 05/26/24 1041  BP: 112/65 111/63  Pulse: 84 89  Resp: 14 16  Temp: 98.2 F (36.8 C) 98.5 F (36.9 C)  SpO2: 97% 97%    PE: No fundal tenderness. Pain does not worsen with straight leg test, muscle palpation. No CVA tenderness. Pain worsens with movement, improved with dilaudid .   Fetal status reassuring. Mod variability, 140s baseline, appropriate for gestational age, reactive.  A: Left flank pain, worsening right flank pain. Does not appear to be obstetric. The pain is affecting her functioning.  Ddx is extensive. Will r/o gall bladder dysfunction, get BMP for kidney function and if normal, plan for low dose CT to evaluate appy and improved assessment of kidneys and ureters.  - Will continue IVF fluids and pain medication. She is on dilaudid  1mg  q3 hrs without a basal rate, which is working. - will get RUQ ultrasound to evaluate GB and  liver - will get limited OB scan to confirm fluid and placenta integrity - Because of elevated WBC, which may be only reactive but is high, will ppx give rocephin  x1 while awaiting imaging.
